# Patient Record
Sex: Female | Born: 1975 | Race: White | Hispanic: No | Marital: Married | Smoking: Never smoker
Health system: Southern US, Community
[De-identification: ages and names within clinical notes are randomized; demographics above are authoritative.]

## PROBLEM LIST (undated history)

## (undated) DIAGNOSIS — I739 Peripheral vascular disease, unspecified: Secondary | ICD-10-CM

## (undated) DIAGNOSIS — O99119 Other diseases of the blood and blood-forming organs and certain disorders involving the immune mechanism complicating pregnancy, unspecified trimester: Secondary | ICD-10-CM

## (undated) DIAGNOSIS — O09529 Supervision of elderly multigravida, unspecified trimester: Secondary | ICD-10-CM

## (undated) DIAGNOSIS — I8390 Asymptomatic varicose veins of unspecified lower extremity: Secondary | ICD-10-CM

## (undated) DIAGNOSIS — D696 Thrombocytopenia, unspecified: Secondary | ICD-10-CM

## (undated) HISTORY — DX: Other diseases of the blood and blood-forming organs and certain disorders involving the immune mechanism complicating pregnancy, unspecified trimester: O99.119

## (undated) HISTORY — PX: WISDOM TOOTH EXTRACTION: SHX21

## (undated) HISTORY — DX: Supervision of elderly multigravida, unspecified trimester: O09.529

## (undated) HISTORY — DX: Asymptomatic varicose veins of unspecified lower extremity: I83.90

## (undated) HISTORY — DX: Other diseases of the blood and blood-forming organs and certain disorders involving the immune mechanism complicating pregnancy, unspecified trimester: D69.6

## (undated) HISTORY — DX: Peripheral vascular disease, unspecified: I73.9

---

## 2009-04-15 ENCOUNTER — Encounter: Admission: RE | Admit: 2009-04-15 | Discharge: 2009-04-15 | Payer: Self-pay | Admitting: Obstetrics and Gynecology

## 2009-05-06 ENCOUNTER — Encounter: Admission: RE | Admit: 2009-05-06 | Discharge: 2009-05-06 | Payer: Self-pay | Admitting: Obstetrics and Gynecology

## 2009-08-19 ENCOUNTER — Encounter: Admission: RE | Admit: 2009-08-19 | Discharge: 2009-08-19 | Payer: Self-pay | Admitting: Obstetrics and Gynecology

## 2010-09-01 ENCOUNTER — Other Ambulatory Visit: Payer: Self-pay | Admitting: Obstetrics and Gynecology

## 2010-09-05 ENCOUNTER — Inpatient Hospital Stay (HOSPITAL_COMMUNITY): Admission: AD | Admit: 2010-09-05 | Discharge: 2010-09-07 | Payer: Self-pay | Admitting: Obstetrics and Gynecology

## 2011-03-08 LAB — CBC
HCT: 31.8 % — ABNORMAL LOW (ref 36.0–46.0)
HCT: 35.7 % — ABNORMAL LOW (ref 36.0–46.0)
Hemoglobin: 10.5 g/dL — ABNORMAL LOW (ref 12.0–15.0)
Hemoglobin: 10.7 g/dL — ABNORMAL LOW (ref 12.0–15.0)
Hemoglobin: 10.8 g/dL — ABNORMAL LOW (ref 12.0–15.0)
Hemoglobin: 11.4 g/dL — ABNORMAL LOW (ref 12.0–15.0)
MCH: 27.4 pg (ref 26.0–34.0)
MCH: 27.8 pg (ref 26.0–34.0)
MCHC: 32.4 g/dL (ref 30.0–36.0)
MCHC: 33.8 g/dL (ref 30.0–36.0)
MCV: 84.5 fL (ref 78.0–100.0)
MCV: 85.9 fL (ref 78.0–100.0)
Platelets: 82 10*3/uL — ABNORMAL LOW (ref 150–400)
Platelets: 84 10*3/uL — ABNORMAL LOW (ref 150–400)
Platelets: 88 10*3/uL — ABNORMAL LOW (ref 150–400)
RBC: 3.4 MIL/uL — ABNORMAL LOW (ref 3.87–5.11)
RBC: 3.72 MIL/uL — ABNORMAL LOW (ref 3.87–5.11)
RBC: 3.77 MIL/uL — ABNORMAL LOW (ref 3.87–5.11)
RBC: 3.79 MIL/uL — ABNORMAL LOW (ref 3.87–5.11)
RBC: 3.86 MIL/uL — ABNORMAL LOW (ref 3.87–5.11)
RBC: 4.15 MIL/uL (ref 3.87–5.11)
RDW: 15.4 % (ref 11.5–15.5)
RDW: 15.5 % (ref 11.5–15.5)
WBC: 13.7 10*3/uL — ABNORMAL HIGH (ref 4.0–10.5)
WBC: 16.6 10*3/uL — ABNORMAL HIGH (ref 4.0–10.5)
WBC: 18.3 10*3/uL — ABNORMAL HIGH (ref 4.0–10.5)
WBC: 8.9 10*3/uL (ref 4.0–10.5)

## 2011-03-08 LAB — COMPREHENSIVE METABOLIC PANEL
ALT: 14 U/L (ref 0–35)
AST: 33 U/L (ref 0–37)
CO2: 23 mEq/L (ref 19–32)
Chloride: 106 mEq/L (ref 96–112)
Creatinine, Ser: 0.66 mg/dL (ref 0.4–1.2)
Glucose, Bld: 120 mg/dL — ABNORMAL HIGH (ref 70–99)
Total Bilirubin: 0.6 mg/dL (ref 0.3–1.2)

## 2011-03-08 LAB — URIC ACID: Uric Acid, Serum: 4.7 mg/dL (ref 2.4–7.0)

## 2011-03-08 LAB — SURGICAL PCR SCREEN
MRSA, PCR: NEGATIVE
Staphylococcus aureus: NEGATIVE

## 2011-03-08 LAB — RPR: RPR Ser Ql: NONREACTIVE

## 2011-03-08 LAB — LACTATE DEHYDROGENASE: LDH: 119 U/L (ref 94–250)

## 2011-12-25 NOTE — L&D Delivery Note (Signed)
Delivery Note At 1:33 PM a viable female was delivered via VBAC, Spontaneous (Presentation: ;  ).  APGAR: , ; weight .   Placenta status: , .  Cord:  with the following complications: .  Cord pH: sent  Anesthesia: Epidural  Episiotomy: None  Lacerations: sec deg Suture Repair: 3.0 vicryl rapide Est. Blood Loss (mL): 350cc  Mom to postpartum.  Baby to nursery-stable.  Meriel Pica 07/25/2012, 1:52 PM

## 2011-12-27 LAB — OB RESULTS CONSOLE HIV ANTIBODY (ROUTINE TESTING): HIV: NONREACTIVE

## 2011-12-27 LAB — OB RESULTS CONSOLE GC/CHLAMYDIA
Chlamydia: NEGATIVE
Gonorrhea: NEGATIVE

## 2011-12-27 LAB — OB RESULTS CONSOLE RPR: RPR: NONREACTIVE

## 2012-07-01 LAB — OB RESULTS CONSOLE GBS: GBS: NEGATIVE

## 2012-07-24 ENCOUNTER — Encounter (HOSPITAL_COMMUNITY): Payer: Self-pay | Admitting: *Deleted

## 2012-07-24 ENCOUNTER — Telehealth (HOSPITAL_COMMUNITY): Payer: Self-pay | Admitting: *Deleted

## 2012-07-24 NOTE — Telephone Encounter (Signed)
Preadmission screen  

## 2012-07-25 ENCOUNTER — Encounter (HOSPITAL_COMMUNITY): Payer: Self-pay

## 2012-07-25 ENCOUNTER — Encounter (HOSPITAL_COMMUNITY): Payer: Self-pay | Admitting: Anesthesiology

## 2012-07-25 ENCOUNTER — Inpatient Hospital Stay (HOSPITAL_COMMUNITY): Payer: 59 | Admitting: Anesthesiology

## 2012-07-25 ENCOUNTER — Inpatient Hospital Stay (HOSPITAL_COMMUNITY)
Admission: RE | Admit: 2012-07-25 | Discharge: 2012-07-26 | DRG: 775 | Disposition: A | Discharge status: Home / Self Care | Payer: 59 | Source: Ambulatory Visit | Attending: Obstetrics and Gynecology | Admitting: Obstetrics and Gynecology

## 2012-07-25 DIAGNOSIS — O34219 Maternal care for unspecified type scar from previous cesarean delivery: Secondary | ICD-10-CM | POA: Diagnosis present

## 2012-07-25 DIAGNOSIS — O09529 Supervision of elderly multigravida, unspecified trimester: Secondary | ICD-10-CM | POA: Diagnosis present

## 2012-07-25 LAB — CBC
HCT: 35.2 % — ABNORMAL LOW (ref 36.0–46.0)
Hemoglobin: 10.9 g/dL — ABNORMAL LOW (ref 12.0–15.0)
MCH: 25.4 pg — ABNORMAL LOW (ref 26.0–34.0)
MCH: 25.5 pg — ABNORMAL LOW (ref 26.0–34.0)
MCV: 82.1 fL (ref 78.0–100.0)
Platelets: 108 10*3/uL — ABNORMAL LOW (ref 150–400)
RBC: 4.29 MIL/uL (ref 3.87–5.11)
RBC: 3.92 MIL/uL (ref 3.87–5.11)
WBC: 10.6 10*3/uL — ABNORMAL HIGH (ref 4.0–10.5)

## 2012-07-25 LAB — RPR: RPR Ser Ql: NONREACTIVE

## 2012-07-25 LAB — ABO/RH: ABO/RH(D): O POS

## 2012-07-25 MED ORDER — SENNOSIDES-DOCUSATE SODIUM 8.6-50 MG PO TABS
2.0000 | ORAL_TABLET | Freq: Every day | ORAL | Status: DC
  Administered 2012-07-25: 2 via ORAL

## 2012-07-25 MED ORDER — BISACODYL 10 MG RE SUPP: 10.0000 mg | Freq: Every day | RECTAL | Status: DC | PRN

## 2012-07-25 MED ORDER — OXYCODONE-ACETAMINOPHEN 5-325 MG PO TABS: 1.0000 | ORAL_TABLET | ORAL | Status: DC | PRN

## 2012-07-25 MED ORDER — OXYCODONE-ACETAMINOPHEN 5-325 MG PO TABS
1.0000 | ORAL_TABLET | Freq: Four times a day (QID) | ORAL | Status: DC | PRN
  Administered 2012-07-25 – 2012-07-26 (×3): 1 via ORAL
  Filled 2012-07-25 (×3): qty 1

## 2012-07-25 MED ORDER — DIPHENHYDRAMINE HCL 25 MG PO CAPS: 25.0000 mg | ORAL_CAPSULE | Freq: Four times a day (QID) | ORAL | Status: DC | PRN

## 2012-07-25 MED ORDER — LACTATED RINGERS IV SOLN: 500.0000 mL | Freq: Once | INTRAVENOUS | Status: DC

## 2012-07-25 MED ORDER — OXYTOCIN 40 UNITS IN LACTATED RINGERS INFUSION - SIMPLE MED
62.5000 mL/h | Freq: Once | INTRAVENOUS | Status: AC
  Administered 2012-07-25: 62.5 mL/h via INTRAVENOUS
  Filled 2012-07-25: qty 1000

## 2012-07-25 MED ORDER — ZOLPIDEM TARTRATE 5 MG PO TABS: 5.0000 mg | ORAL_TABLET | Freq: Every evening | ORAL | Status: DC | PRN

## 2012-07-25 MED ORDER — LACTATED RINGERS IV SOLN
INTRAVENOUS | Status: DC
  Administered 2012-07-25 (×2): via INTRAVENOUS

## 2012-07-25 MED ORDER — LANOLIN HYDROUS EX OINT: TOPICAL_OINTMENT | CUTANEOUS | Status: DC | PRN

## 2012-07-25 MED ORDER — WITCH HAZEL-GLYCERIN EX PADS
1.0000 "application " | MEDICATED_PAD | CUTANEOUS | Status: DC | PRN
  Administered 2012-07-25: 1 via TOPICAL

## 2012-07-25 MED ORDER — CITRIC ACID-SODIUM CITRATE 334-500 MG/5ML PO SOLN: 30.0000 mL | ORAL | Status: DC | PRN

## 2012-07-25 MED ORDER — IBUPROFEN 800 MG PO TABS
800.0000 mg | ORAL_TABLET | Freq: Three times a day (TID) | ORAL | Status: DC | PRN
  Administered 2012-07-25 – 2012-07-26 (×2): 800 mg via ORAL
  Filled 2012-07-25 (×3): qty 1

## 2012-07-25 MED ORDER — FLEET ENEMA 7-19 GM/118ML RE ENEM: 1.0000 | ENEMA | RECTAL | Status: DC | PRN

## 2012-07-25 MED ORDER — MEASLES, MUMPS & RUBELLA VAC ~~LOC~~ INJ
0.5000 mL | INJECTION | Freq: Once | SUBCUTANEOUS | Status: DC
  Filled 2012-07-25: qty 0.5

## 2012-07-25 MED ORDER — IBUPROFEN 600 MG PO TABS: 600.0000 mg | ORAL_TABLET | Freq: Four times a day (QID) | ORAL | Status: DC | PRN

## 2012-07-25 MED ORDER — TETANUS-DIPHTH-ACELL PERTUSSIS 5-2.5-18.5 LF-MCG/0.5 IM SUSP: 0.5000 mL | Freq: Once | INTRAMUSCULAR | Status: DC

## 2012-07-25 MED ORDER — FENTANYL 2.5 MCG/ML BUPIVACAINE 1/10 % EPIDURAL INFUSION (WH - ANES)
14.0000 mL/h | INTRAMUSCULAR | Status: DC
  Administered 2012-07-25: 14 mL/h via EPIDURAL
  Filled 2012-07-25: qty 60

## 2012-07-25 MED ORDER — FLEET ENEMA 7-19 GM/118ML RE ENEM: 1.0000 | ENEMA | Freq: Every day | RECTAL | Status: DC | PRN

## 2012-07-25 MED ORDER — DIBUCAINE 1 % RE OINT
1.0000 "application " | TOPICAL_OINTMENT | RECTAL | Status: DC | PRN
  Administered 2012-07-26: 1 via RECTAL
  Filled 2012-07-25: qty 28

## 2012-07-25 MED ORDER — DIPHENHYDRAMINE HCL 50 MG/ML IJ SOLN: 12.5000 mg | INTRAMUSCULAR | Status: DC | PRN

## 2012-07-25 MED ORDER — ONDANSETRON HCL 4 MG PO TABS: 4.0000 mg | ORAL_TABLET | ORAL | Status: DC | PRN

## 2012-07-25 MED ORDER — LIDOCAINE HCL (PF) 1 % IJ SOLN
INTRAMUSCULAR | Status: DC | PRN
  Administered 2012-07-25 (×2): 5 mL

## 2012-07-25 MED ORDER — BENZOCAINE-MENTHOL 20-0.5 % EX AERO
1.0000 "application " | INHALATION_SPRAY | CUTANEOUS | Status: DC | PRN
  Administered 2012-07-25: 1 via TOPICAL
  Filled 2012-07-25: qty 56

## 2012-07-25 MED ORDER — PHENYLEPHRINE 40 MCG/ML (10ML) SYRINGE FOR IV PUSH (FOR BLOOD PRESSURE SUPPORT)
80.0000 ug | PREFILLED_SYRINGE | INTRAVENOUS | Status: DC | PRN
  Filled 2012-07-25: qty 5

## 2012-07-25 MED ORDER — EPHEDRINE 5 MG/ML INJ
10.0000 mg | INTRAVENOUS | Status: DC | PRN
  Filled 2012-07-25: qty 4

## 2012-07-25 MED ORDER — ACETAMINOPHEN 325 MG PO TABS: 650.0000 mg | ORAL_TABLET | ORAL | Status: DC | PRN

## 2012-07-25 MED ORDER — OXYTOCIN BOLUS FROM INFUSION
250.0000 mL | Freq: Once | INTRAVENOUS | Status: DC
  Filled 2012-07-25: qty 500

## 2012-07-25 MED ORDER — ONDANSETRON HCL 4 MG/2ML IJ SOLN: 4.0000 mg | Freq: Four times a day (QID) | INTRAMUSCULAR | Status: DC | PRN

## 2012-07-25 MED ORDER — LACTATED RINGERS IV SOLN: 500.0000 mL | INTRAVENOUS | Status: DC | PRN

## 2012-07-25 MED ORDER — SIMETHICONE 80 MG PO CHEW
80.0000 mg | CHEWABLE_TABLET | ORAL | Status: DC | PRN
Start: 2012-07-25 — End: 2012-07-26

## 2012-07-25 MED ORDER — ONDANSETRON HCL 4 MG/2ML IJ SOLN: 4.0000 mg | INTRAMUSCULAR | Status: DC | PRN

## 2012-07-25 MED ORDER — LIDOCAINE HCL (PF) 1 % IJ SOLN
30.0000 mL | INTRAMUSCULAR | Status: DC | PRN
  Filled 2012-07-25: qty 30

## 2012-07-25 MED ORDER — PRENATAL MULTIVITAMIN CH
1.0000 | ORAL_TABLET | Freq: Every day | ORAL | Status: DC
  Administered 2012-07-25 – 2012-07-26 (×2): 1 via ORAL
  Filled 2012-07-25 (×2): qty 1

## 2012-07-25 MED ORDER — EPHEDRINE 5 MG/ML INJ: 10.0000 mg | INTRAVENOUS | Status: DC | PRN

## 2012-07-25 MED ORDER — PHENYLEPHRINE 40 MCG/ML (10ML) SYRINGE FOR IV PUSH (FOR BLOOD PRESSURE SUPPORT): 80.0000 ug | PREFILLED_SYRINGE | INTRAVENOUS | Status: DC | PRN

## 2012-07-25 NOTE — Anesthesia Procedure Notes (Signed)
Epidural Patient location during procedure: OB Start time: 07/25/2012 10:29 AM  Staffing Anesthesiologist: Brayton Caves R Performed by: anesthesiologist   Preanesthetic Checklist Completed: patient identified, site marked, surgical consent, pre-op evaluation, timeout performed, IV checked, risks and benefits discussed and monitors and equipment checked  Epidural Patient position: sitting Prep: site prepped and draped and DuraPrep Patient monitoring: continuous pulse ox and blood pressure Approach: midline Injection technique: LOR air and LOR saline  Needle:  Needle type: Tuohy  Needle gauge: 17 G Needle length: 9 cm Needle insertion depth: 5 cm cm Catheter type: closed end flexible Catheter size: 19 Gauge Catheter at skin depth: 10 cm Test dose: negative  Assessment Events: blood not aspirated, injection not painful, no injection resistance, negative IV test and no paresthesia  Additional Notes Patient identified.  Risk benefits discussed including failed block, incomplete pain control, headache, nerve damage, paralysis, blood pressure changes, nausea, vomiting, reactions to medication both toxic or allergic, and postpartum back pain.  Patient expressed understanding and wished to proceed.  All questions were answered.  Sterile technique used throughout procedure and epidural site dressed with sterile barrier dressing. No paresthesia or other complications noted.The patient did not experience any signs of intravascular injection such as tinnitus or metallic taste in mouth nor signs of intrathecal spread such as rapid motor block. Please see nursing notes for vital signs.

## 2012-07-25 NOTE — Progress Notes (Signed)
Patient ID: Deanna Wolf, female   DOB: [birth year], 36 y.o.   MRN: [id number] 4/90/vtx/-2>>AROM>>clear, stable FHR>>for epid

## 2012-07-25 NOTE — Anesthesia Preprocedure Evaluation (Deleted)
Anesthesia Evaluation  Patient identified by MRN, date of birth, ID band Patient awake    Reviewed: Allergy & Precautions, H&P , NPO status , Patient's Chart, lab work & pertinent test results, reviewed documented beta blocker date and time   Airway Mallampati: II TM Distance: >3 FB Neck ROM: full    Dental No notable dental hx.    Pulmonary neg pulmonary ROS,  breath sounds clear to auscultation  Pulmonary exam normal       Cardiovascular negative cardio ROS  Rhythm:regular Rate:Normal     Neuro/Psych negative neurological ROS  negative psych ROS   GI/Hepatic negative GI ROS, Neg liver ROS,   Endo/Other  negative endocrine ROS  Renal/GU      Musculoskeletal   Abdominal Normal abdominal exam  (+)   Peds  Hematology negative hematology ROS (+)   Anesthesia Other Findings   Reproductive/Obstetrics (+) Pregnancy                           Anesthesia Physical Anesthesia Plan Anesthesia Quick Evaluation

## 2012-07-25 NOTE — H&P (Signed)
NAMENOA, GALVAO NO.:  [id number]  MEDICAL RECORD NO.:  [id number]  LOCATION:                                 FACILITY:  PHYSICIAN:  Duke Salvia. Marcelle Overlie, M.D.DATE OF BIRTH:  June 17, 1976  DATE OF ADMISSION:  07/25/2012 DATE OF DISCHARGE:                             HISTORY & PHYSICAL   CHIEF COMPLAINT:  For labor induction at term.  HISTORY OF PRESENT ILLNESS:  A 36 year old, G4 P2-0-1-2 EDD is July 28, 2012.  She presents at term with a favorable cervix for labor induction after an uncomplicated pregnancy.  She has had 1 prior LTCS in [place] in 2009 followed by successful VBAC in 2011 and wants to pursue VBAC. Specific risks of VBAC related to fetal distress, the need for cesarean section, uterine rupture had been discussed with her and she has signed the VBAC informed consent information in our office.  Blood type is O positive.  GBS was negative.  PAST MEDICAL HISTORY:  Please see the [facility] form for completion of her past medical history details.  PHYSICAL EXAMINATION:  VITAL SIGNS:  Temp 98.2, blood pressure 120/72. HEENT:  Unremarkable. NECK:  Supple without masses. LUNGS:  Clear. CARDIOVASCULAR:  Regular rate and rhythm without murmurs, rubs, or gallops noted.  BREASTS :  Not examined. PELVIC:  Term fundal height.  Fetal heart rate 140.  Cervix was 3-4, 75%, vertex, -2.  Membranes intact. EXTREMITIES:  Unremarkable. NEUROLOGIC:  Unremarkable.  IMPRESSION: 1. Term intrauterine pregnancy, favorable cervix. 2. History of prior low transverse cesarean section for vaginal birth     after a cesarean. Procedure and risks discussed as above.  PLAN:  AROM induction.     Amandalynn Pitz M. Marcelle Overlie, M.D.    [facility]/MEDQ  D:  07/24/2012  T:  07/24/2012  Job:  [id number]

## 2012-07-25 NOTE — Anesthesia Preprocedure Evaluation (Deleted)
Anesthesia Evaluation  Patient identified by MRN, date of birth, ID band Patient awake    Reviewed: Allergy & Precautions, H&P , NPO status , Patient's Chart, lab work & pertinent test results, reviewed documented beta blocker date and time   History of Anesthesia Complications (+) AWARENESS UNDER ANESTHESIA  Airway Mallampati: II TM Distance: >3 FB Neck ROM: full    Dental No notable dental hx.    Pulmonary neg pulmonary ROS,  breath sounds clear to auscultation  Pulmonary exam normal       Cardiovascular negative cardio ROS  Rhythm:regular Rate:Normal     Neuro/Psych negative neurological ROS  negative psych ROS   GI/Hepatic negative GI ROS, Neg liver ROS,   Endo/Other  negative endocrine ROS  Renal/GU      Musculoskeletal   Abdominal Normal abdominal exam  (+)   Peds  Hematology negative hematology ROS (+)   Anesthesia Other Findings   Reproductive/Obstetrics (+) Pregnancy                          Anesthesia Physical Anesthesia Plan Anesthesia Quick Evaluation

## 2012-07-25 NOTE — Anesthesia Preprocedure Evaluation (Signed)
Anesthesia Evaluation  Patient identified by MRN, date of birth, ID band Patient awake    Reviewed: Allergy & Precautions, H&P , NPO status , Patient's Chart, lab work & pertinent test results, reviewed documented beta blocker date and time   Airway Mallampati: II TM Distance: >3 FB Neck ROM: full    Dental No notable dental hx.    Pulmonary neg pulmonary ROS,  breath sounds clear to auscultation  Pulmonary exam normal       Cardiovascular negative cardio ROS  Rhythm:regular Rate:Normal     Neuro/Psych negative neurological ROS  negative psych ROS   GI/Hepatic negative GI ROS, Neg liver ROS,   Endo/Other  negative endocrine ROS  Renal/GU      Musculoskeletal   Abdominal Normal abdominal exam  (+)   Peds  Hematology negative hematology ROS (+)   Anesthesia Other Findings   Reproductive/Obstetrics (+) Pregnancy                           Anesthesia Physical Anesthesia Plan  ASA: II  Anesthesia Plan: Epidural   Post-op Pain Management:    Induction:   Airway Management Planned:   Additional Equipment:   Intra-op Plan:   Post-operative Plan:   Informed Consent: I have reviewed the patients History and Physical, chart, labs and discussed the procedure including the risks, benefits and alternatives for the proposed anesthesia with the patient or authorized representative who has indicated his/her understanding and acceptance.     Plan Discussed with:   Anesthesia Plan Comments:         Anesthesia Quick Evaluation

## 2012-07-25 NOTE — H&P (Signed)
Deanna Wolf  DICTATION # [id number] CSN# [id number]   Meriel Pica, MD 07/25/2012 9:57 AM

## 2012-07-26 LAB — CBC
HCT: 27.9 % — ABNORMAL LOW (ref 36.0–46.0)
RDW: 16.1 % — ABNORMAL HIGH (ref 11.5–15.5)
WBC: 11 10*3/uL — ABNORMAL HIGH (ref 4.0–10.5)

## 2012-07-26 MED ORDER — IBUPROFEN 800 MG PO TABS: 800.0000 mg | ORAL_TABLET | Freq: Three times a day (TID) | ORAL | Status: AC | PRN

## 2012-07-26 MED ORDER — OXYCODONE-ACETAMINOPHEN 5-325 MG PO TABS: 1.0000 | ORAL_TABLET | Freq: Four times a day (QID) | ORAL | Status: AC | PRN

## 2012-07-26 NOTE — Discharge Summary (Signed)
Obstetric Discharge Summary Reason for Admission: induction of labor Prenatal Procedures: none Intrapartum Procedures: spontaneous vaginal delivery Postpartum Procedures: none Complications-Operative and Postpartum: none Hemoglobin  Date Value Range Status  07/26/2012 8.8* 12.0 - 15.0 g/dL Final     HCT  Date Value Range Status  07/26/2012 27.9* 36.0 - 46.0 % Final    Physical Exam:  General: alert Lochia: appropriate Uterine Fundus: firm Incision: healing well DVT Evaluation: No evidence of DVT seen on physical exam.  Discharge Diagnoses: Term Pregnancy-delivered, gestational TCP  Discharge Information: Date: 07/26/2012 Activity: pelvic rest Diet: routine Medications: Ibuprofen and Percocet Condition: stable Instructions: refer to practice specific booklet Discharge to: home Follow-up Information    Follow up with Meriel Pica, MD. Schedule an appointment as soon as possible for a visit in 6 weeks.   Contact information:   [address] [place] [address] [phone]          Newborn Data: Live born female  Birth Weight: 8 lb 6.6 oz (3815 g) APGAR: 9, 9  Home with mother.  Meriel Pica 07/26/2012, 8:51 AM

## 2012-07-26 NOTE — Progress Notes (Signed)
Post Partum Day 1 Subjective: no complaints  Objective: Blood pressure 100/66, pulse 79, temperature 97.5 F (36.4 C), temperature source Oral, resp. rate 18, height 5\' 7"  (1.702 m), weight 183 lb (83.008 kg), last menstrual period 10/22/2011, SpO2 98.00%, unknown if currently breastfeeding.  Physical Exam:  General: alert Lochia: appropriate Uterine Fundus: firm Incision: healing well DVT Evaluation: No evidence of DVT seen on physical exam.   Basename 07/26/12 0530 07/25/12 1414  HGB 8.8* 10.0*  HCT 27.9* 32.3*   CBC    Component Value Date/Time   WBC 11.0* 07/26/2012 0530   RBC 3.41* 07/26/2012 0530   HGB 8.8* 07/26/2012 0530   HCT 27.9* 07/26/2012 0530   PLT 99* 07/26/2012 0530   MCV 81.8 07/26/2012 0530   MCH 25.8* 07/26/2012 0530   MCHC 31.5 07/26/2012 0530   RDW 16.1* 07/26/2012 0530      Assessment/Plan: Discharge home   LOS: 1 day   Graeme Menees M 07/26/2012, 8:49 AM

## 2012-07-27 ENCOUNTER — Inpatient Hospital Stay (HOSPITAL_COMMUNITY): Admission: AD | Admit: 2012-07-27 | Payer: Self-pay | Source: Ambulatory Visit | Admitting: Obstetrics and Gynecology

## 2012-07-29 LAB — TYPE AND SCREEN

## 2012-10-27 ENCOUNTER — Encounter: Payer: Self-pay | Admitting: Vascular Surgery

## 2012-10-28 ENCOUNTER — Encounter: Payer: Self-pay | Admitting: Vascular Surgery

## 2012-10-28 ENCOUNTER — Encounter (INDEPENDENT_AMBULATORY_CARE_PROVIDER_SITE_OTHER): Payer: 59 | Admitting: *Deleted

## 2012-10-28 ENCOUNTER — Ambulatory Visit (INDEPENDENT_AMBULATORY_CARE_PROVIDER_SITE_OTHER): Payer: 59 | Admitting: Vascular Surgery

## 2012-10-28 VITALS — BP 118/79 | HR 74 | Resp 16 | Ht 67.0 in | Wt 145.0 lb

## 2012-10-28 DIAGNOSIS — M79609 Pain in unspecified limb: Secondary | ICD-10-CM

## 2012-10-28 DIAGNOSIS — I83893 Varicose veins of bilateral lower extremities with other complications: Secondary | ICD-10-CM | POA: Insufficient documentation

## 2012-10-28 DIAGNOSIS — M7989 Other specified soft tissue disorders: Secondary | ICD-10-CM

## 2012-10-28 NOTE — Progress Notes (Signed)
Subjective:     Patient ID: Deanna Wolf, female   DOB: 08/19/1976, 36 y.o.   MRN: [id number]  HPI this 36 year old healthy female has had bulging varicosities in the right leg for the past 5 years since her first pregnancy. She has no history of DVT or thrombophlebitis were stasis ulcers or bleeding. After each pregnancy the varicosities in the right medial calf have become larger and more symptomatic. This consists of aching throbbing and burning discomfort. She has no symptoms in the left leg. Her last child was delivered 3 months ago. Her varicosities have persisted. She was evaluated at [facility] in January of 2013 and laser ablation of the right great saphenous vein was recommended. His was approved in October of 2013 and she sided to come here for treatment. She has been wearing long leg elastic compression stockings 20-30 mm gradient since January of 2013. She works as a Psychologist, clinical. She does try elevating her legs when she can and trying ibuprofen without success.  Past Medical History  Diagnosis Date  . AMA (advanced maternal age) multigravida 35+   . Thrombocytopenia complicating pregnancy   . Varicose veins     History  Substance Use Topics  . Smoking status: Never Smoker   . Smokeless tobacco: Never Used  . Alcohol Use: No    Family History  Problem Relation Age of Onset  . Cancer Mother     breast  . Hypertension Father     No Known Allergies  No current outpatient prescriptions on file.  BP 118/79  Pulse 74  Resp 16  Ht 5\' 7"  (1.702 m)  Wt 145 lb (65.772 kg)  BMI 22.71 kg/m2  Body mass index is 22.71 kg/(m^2).          Review of Systems denies chest pain, dyspnea on exertion, PND, orthopnea, hemoptysis. Biggest problem is swelling in the right leg which worsens as the day progresses. Other systems negative and complete review of systems    Objective:   Physical Exam blood pressure 118/79 heart rate 74 respirations 16 Gen.-alert and  oriented x3 in no apparent distress HEENT normal for age Lungs no rhonchi or wheezing Cardiovascular regular rhythm no murmurs carotid pulses 3+ palpable no bruits audible Abdomen soft nontender no palpable masses Musculoskeletal free of  major deformities Skin clear -no rashes Neurologic normal Lower extremities 3+ femoral and dorsalis pedis pulses palpable bilaterally with 1+ edema right calf and ankle no edema on the left Bulging varicosities in the right medial calf down to the ankle over great saphenous system with spider veins emanating anteriorly and posteriorly from this area. No hyperpigmentation or ulceration is noted. Left leg is free of varicosities.  Today I ordered venous duplex exam of both legs I reviewed and interpreted. There is gross reflux throughout the right great saphenous vein supplying these bulging varicosities. Right small saphenous vein has no reflux. There is no DVT. There is mild deep vein reflux on the right. Left leg has gross reflux in the great saphenous vein but the vein caliber is smaller and there are no bulging varicosities on the left.      Assessment:     Gross reflux right great saphenous vein with bulging varicosities and swelling causing severe symptoms-resistant to conservative measures-long-leg elastic compression stockings 20-30 mm gradient, ibuprofen, and elevation. Patient has been approved for laser ablation right great saphenous vein recently Best plan would be to proceed with laser ablation right great saphenous vein with greater than 20  stab phlebectomy as single procedure to relieve her symptomatology    Plan:     Will proceed with precertification to perform laser ablation right great saphenous vein with greater than 20 stab phlebectomy in near future

## 2012-11-04 ENCOUNTER — Encounter: Payer: 59 | Admitting: Vascular Surgery

## 2012-11-06 ENCOUNTER — Other Ambulatory Visit: Payer: Self-pay | Admitting: *Deleted

## 2012-11-06 DIAGNOSIS — I83893 Varicose veins of bilateral lower extremities with other complications: Secondary | ICD-10-CM

## 2012-11-11 ENCOUNTER — Encounter: Payer: 59 | Admitting: Vascular Surgery

## 2012-11-17 ENCOUNTER — Encounter: Payer: 59 | Admitting: Vascular Surgery

## 2012-12-19 ENCOUNTER — Encounter: Payer: Self-pay | Admitting: Vascular Surgery

## 2012-12-22 ENCOUNTER — Ambulatory Visit (INDEPENDENT_AMBULATORY_CARE_PROVIDER_SITE_OTHER): Payer: 59 | Admitting: Vascular Surgery

## 2012-12-22 ENCOUNTER — Encounter: Payer: Self-pay | Admitting: Vascular Surgery

## 2012-12-22 ENCOUNTER — Other Ambulatory Visit: Payer: 59 | Admitting: Vascular Surgery

## 2012-12-22 VITALS — BP 116/78 | HR 66 | Resp 16 | Ht 67.0 in | Wt 145.0 lb

## 2012-12-22 DIAGNOSIS — I83893 Varicose veins of bilateral lower extremities with other complications: Secondary | ICD-10-CM

## 2012-12-22 NOTE — Progress Notes (Signed)
Laser Ablation Procedure      Date: 12/22/2012    Deanna Wolf DOB:06-09-1976  Consent signed: Yes  Surgeon:J.D. Hart [place]  Procedure: Laser Ablation: right Greater Saphenous Vein  BP 116/78  Pulse 66  Resp 16  Ht 5\' 7"  (1.702 m)  Wt 145 lb (65.772 kg)  BMI 22.71 kg/m2  Start time: 1:00   End time: 2:15  Tumescent Anesthesia: 400 cc 0.9% NaCl with 50 cc Lidocaine HCL with 1% Epi and 15 cc 8.4% NaHCO3  Local Anesthesia: 10 cc Lidocaine HCL and NaHCO3 (ratio 2:1)  Pulsed mode: Watts 15 Seconds 1 Pulses:1 Total Pulses:147 Total Energy: 2204 Total Time: 2:26    Stab Phlebectomy: >20 Sites: Calf  Patient tolerated procedure well: Yes  Notes:   Description of Procedure:  After marking the course of the saphenous vein and the secondary varicosities in the standing position, the patient was placed on the operating table in the supine position, and the right leg was prepped and draped in sterile fashion. Local anesthetic was administered, and under ultrasound guidance the saphenous vein was accessed with a micro needle and guide wire; then the micro puncture sheath was placed. A guide wire was inserted to the saphenofemoral junction, followed by a 5 french sheath.  The position of the sheath and then the laser fiber below the junction was confirmed using the ultrasound and visualization of the aiming beam.  Tumescent anesthesia was administered along the course of the saphenous vein using ultrasound guidance. Protective laser glasses were placed on the patient, and the laser was fired at 15 watt pulsed mode advancing 1-2 mm per sec.  For a total of 2204 joules.  A steri strip was applied to the puncture site.  The patient was then put into Trendelenburg position.  Local anesthetic was utilized overlying the marked varicosities.  Greater than 20 stab wounds were made using the tip of an 11 blade; and using the vein hook,  The phlebectomies were performed using a hemostat to avulse these  varicosities.  Adequate hemostasis was achieved, and steri strips were applied to the stab wound.      ABD pads and thigh high compression stockings were applied.  Ace wrap bandages were applied over the phlebectomy sites and at the top of the saphenofemoral junction.  Blood loss was less than 15 cc.  The patient ambulated out of the operating room having tolerated the procedure well.

## 2012-12-22 NOTE — Progress Notes (Signed)
Subjective:     Patient ID: Deanna Wolf, female   DOB: Jul 22, 1976, 36 y.o.   MRN: [id number]  HPI this 36 year old female had laser ablation of the right great saphenous vein performed under local tumescent anesthesia plus greater than 20 stab phlebectomy of painful varicosities. She tolerated the procedure well. A total of 2204 J of energy was utilized    Review of Systems     Objective:   Physical ExamBP 116/78  Pulse 66  Resp 16  Ht 5\' 7"  (1.702 m)  Wt 145 lb (65.772 kg)  BMI 22.71 kg/m2      Assessment:     Well-tolerated laser ablation right great saphenous vein plus multiple stab phlebectomy for secondary varicosities performed under local tumescent anesthesia    Plan:     Return in one week for venous duplex exam right leg to confirm closure right great saphenous vein.

## 2012-12-23 ENCOUNTER — Telehealth: Payer: Self-pay | Admitting: *Deleted

## 2012-12-23 NOTE — Telephone Encounter (Signed)
Left a voice mail. Asked her to call if she is having any problems or concerns. Reminded her of her fu appt.

## 2012-12-29 ENCOUNTER — Encounter: Payer: Self-pay | Admitting: Vascular Surgery

## 2012-12-30 ENCOUNTER — Ambulatory Visit: Payer: 59 | Admitting: Vascular Surgery

## 2012-12-30 ENCOUNTER — Ambulatory Visit (INDEPENDENT_AMBULATORY_CARE_PROVIDER_SITE_OTHER): Payer: 59 | Admitting: Vascular Surgery

## 2012-12-30 ENCOUNTER — Encounter (INDEPENDENT_AMBULATORY_CARE_PROVIDER_SITE_OTHER): Payer: 59 | Admitting: *Deleted

## 2012-12-30 ENCOUNTER — Encounter: Payer: Self-pay | Admitting: Vascular Surgery

## 2012-12-30 VITALS — BP 117/85 | HR 69 | Resp 16 | Ht 66.0 in | Wt 145.0 lb

## 2012-12-30 DIAGNOSIS — I83893 Varicose veins of bilateral lower extremities with other complications: Secondary | ICD-10-CM

## 2012-12-30 DIAGNOSIS — Z48812 Encounter for surgical aftercare following surgery on the circulatory system: Secondary | ICD-10-CM

## 2012-12-30 NOTE — Progress Notes (Signed)
Subjective:     Patient ID: Deanna Wolf, female   DOB: 22-Nov-1976, 37 y.o.   MRN: [id number]  HPI this 37 year old female returns 1 week post laser ablation right great saphenous vein with multiple stab phlebectomy of painful varicosities. She has had no significant edema and has had mild discomfort along the laser ablation site which responded to ice initially and then heat and ibuprofen. She is wearing elastic compression stocking as prescribed.   Review of Systems     Objective:   Physical ExamBP 117/85  Pulse 69  Resp 16  Ht 5\' 6"  (1.676 m)  Wt 145 lb (65.772 kg)  BMI 23.40 kg/m2  General well-developed well-nourished female no apparent stress alert and oriented x3 Right leg with mild discomfort along the course of the great saphenous vein with no ecchymosis. Minimal distal edema noted. Stab phlebectomy sites and calf healing nicely.  Today I ordered a venous duplex exam of the right leg which are reviewed and interpreted. The right greater saphenous vein is occluded up to a 0.1 0.4 cm from the saphenofemoral junction. There is no DVT.    Assessment:     Successful laser ablation right great saphenous vein with multiple stab phlebectomy for painful varicosities    Plan:     Will wear stockings for one more week and then return to see [place] on a when necessary basis

## 2013-01-05 ENCOUNTER — Other Ambulatory Visit: Payer: Self-pay | Admitting: *Deleted

## 2013-01-05 DIAGNOSIS — I83893 Varicose veins of bilateral lower extremities with other complications: Secondary | ICD-10-CM

## 2014-10-25 ENCOUNTER — Encounter: Payer: Self-pay | Admitting: Vascular Surgery

## 2015-05-20 ENCOUNTER — Other Ambulatory Visit: Payer: Self-pay | Admitting: Obstetrics and Gynecology

## 2015-05-20 DIAGNOSIS — Z803 Family history of malignant neoplasm of breast: Secondary | ICD-10-CM

## 2015-06-16 ENCOUNTER — Other Ambulatory Visit: Payer: 59

## 2015-06-29 ENCOUNTER — Other Ambulatory Visit: Payer: 59

## 2015-07-18 ENCOUNTER — Ambulatory Visit
Admission: RE | Admit: 2015-07-18 | Discharge: 2015-07-18 | Disposition: A | Discharge status: Home / Self Care | Payer: 59 | Source: Ambulatory Visit | Attending: Obstetrics and Gynecology | Admitting: Obstetrics and Gynecology

## 2015-07-18 DIAGNOSIS — Z803 Family history of malignant neoplasm of breast: Secondary | ICD-10-CM

## 2015-07-18 MED ORDER — GADOBENATE DIMEGLUMINE 529 MG/ML IV SOLN
13.0000 mL | Freq: Once | INTRAVENOUS | Status: AC | PRN
  Administered 2015-07-18: 13 mL via INTRAVENOUS

## 2016-05-15 DIAGNOSIS — R430 Anosmia: Secondary | ICD-10-CM | POA: Diagnosis not present

## 2016-05-15 DIAGNOSIS — J310 Chronic rhinitis: Secondary | ICD-10-CM | POA: Diagnosis not present

## 2016-05-15 DIAGNOSIS — J343 Hypertrophy of nasal turbinates: Secondary | ICD-10-CM | POA: Diagnosis not present

## 2016-05-17 ENCOUNTER — Other Ambulatory Visit (INDEPENDENT_AMBULATORY_CARE_PROVIDER_SITE_OTHER): Payer: Self-pay | Admitting: Otolaryngology

## 2016-05-17 DIAGNOSIS — J329 Chronic sinusitis, unspecified: Secondary | ICD-10-CM

## 2016-05-22 ENCOUNTER — Other Ambulatory Visit: Payer: 59

## 2016-05-31 ENCOUNTER — Ambulatory Visit
Admission: RE | Admit: 2016-05-31 | Discharge: 2016-05-31 | Disposition: A | Discharge status: Home / Self Care | Payer: BLUE CROSS/BLUE SHIELD | Source: Ambulatory Visit | Attending: Otolaryngology | Admitting: Otolaryngology

## 2016-05-31 DIAGNOSIS — J329 Chronic sinusitis, unspecified: Secondary | ICD-10-CM

## 2016-05-31 DIAGNOSIS — R51 Headache: Secondary | ICD-10-CM | POA: Diagnosis not present

## 2016-06-17 IMAGING — CT CT MAXILLOFACIAL W/O CM
2 series · 16 of 30 positions shown, 18 images · non-contrast
Comparison: None.

CLINICAL DATA: 40-year-old female with facial pain and pressure
symptoms for 1 year. Initial encounter.

EXAM:
CT MAXILLOFACIAL WITHOUT CONTRAST
TECHNIQUE: Multidetector CT imaging of the maxillofacial structures was
performed. Multiplanar CT image reconstructions were also generated.
A small metallic BB was placed on the right temple in order to
reliably differentiate right from left.

[Series 3: axial soft 1.25 · axial · 0.49mm/px · z∈[-57,+110]mm · 8 of 170 slices shown]
[im 18/170  brain]
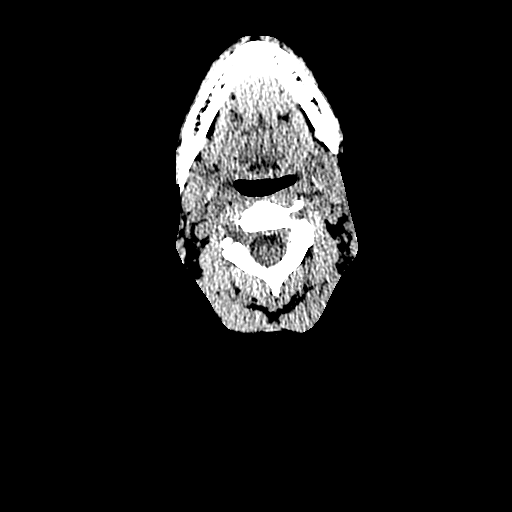
[im 36/170  brain]
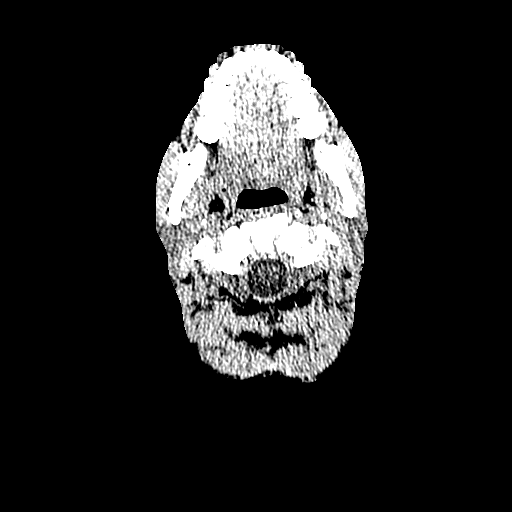
[im 54/170  brain]
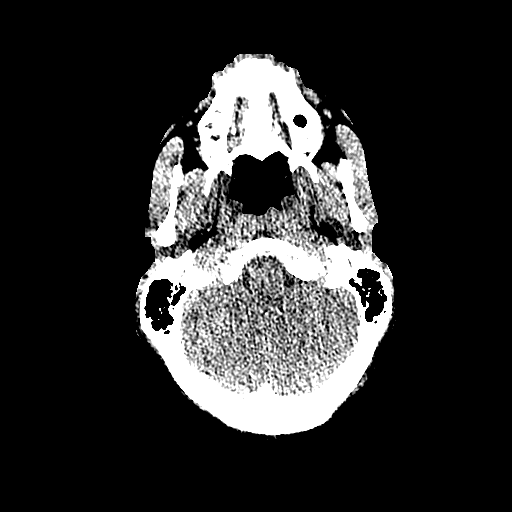
[im 72/170  brain]
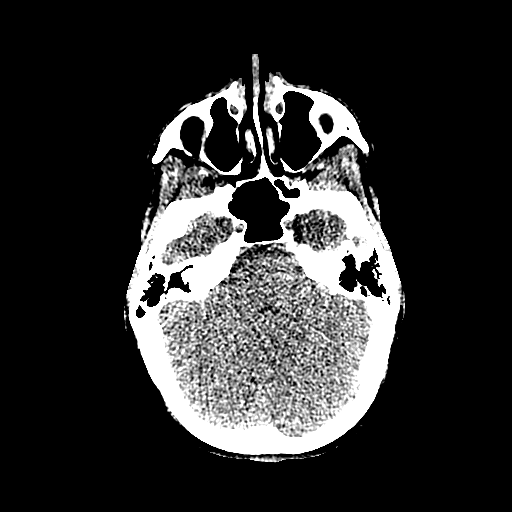
[im 98/170  brain]
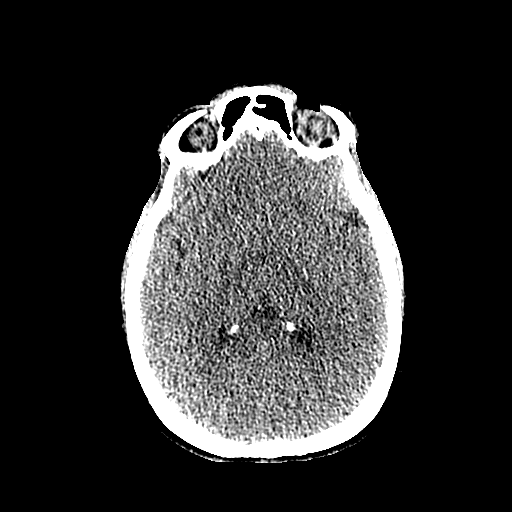
[im 116/170  brain]
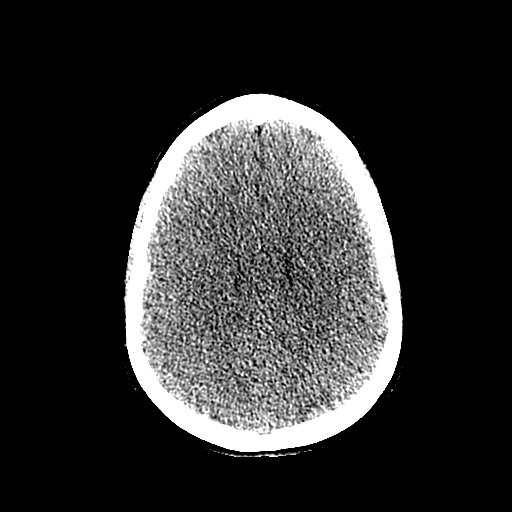
[im 134/170  brain]
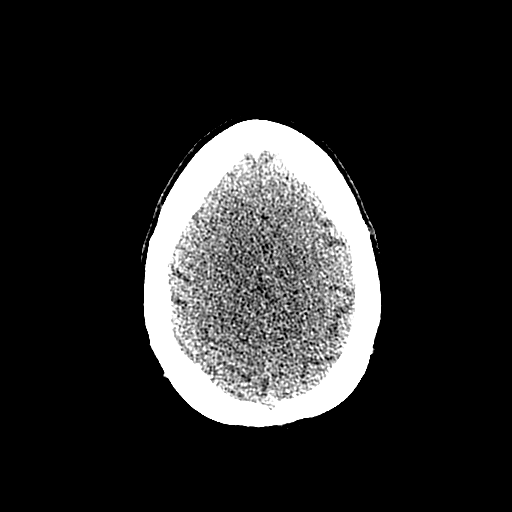
[im 152/170  brain]
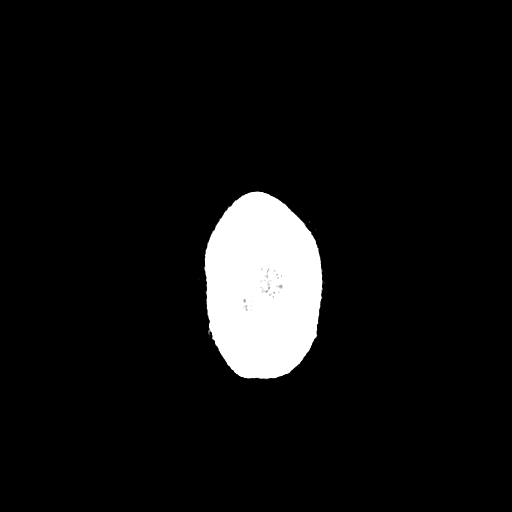

[Series 602: sagittal facial · sagittal · 0.49mm/px · 8 of 80 slices shown, 10 images]
[im 9/80  brain]
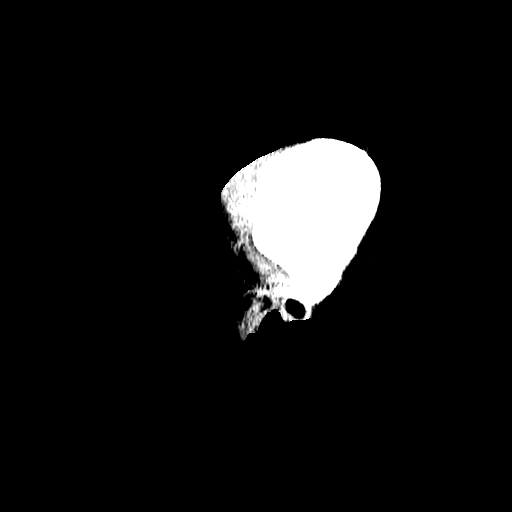
[im 9/80  bone]
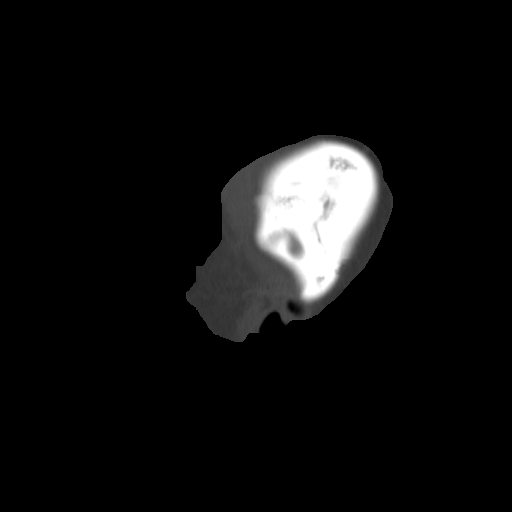
[im 18/80  bone]
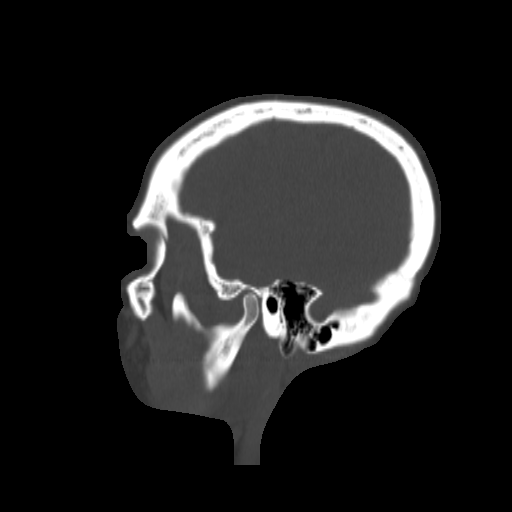
[im 27/80  bone]
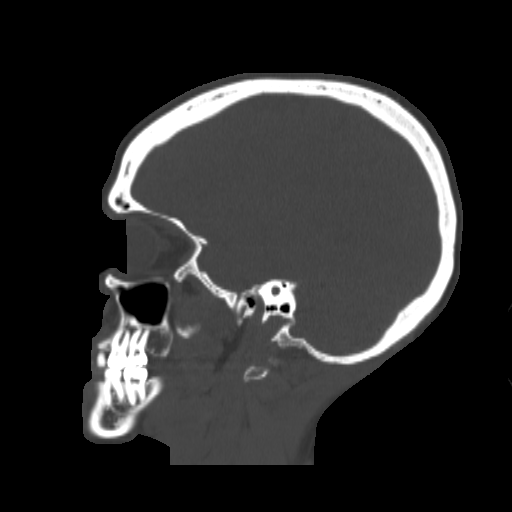
[im 36/80  bone]
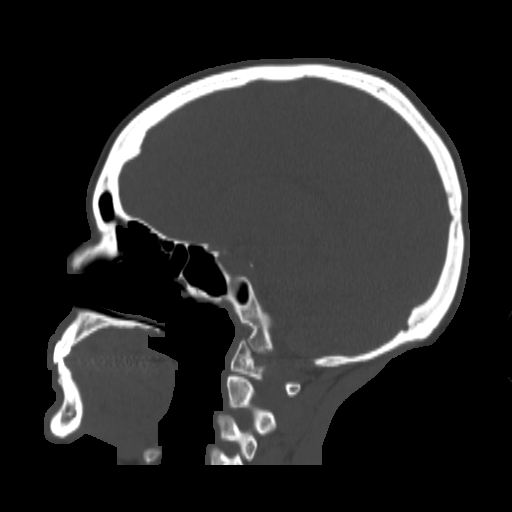
[im 44/80  brain]
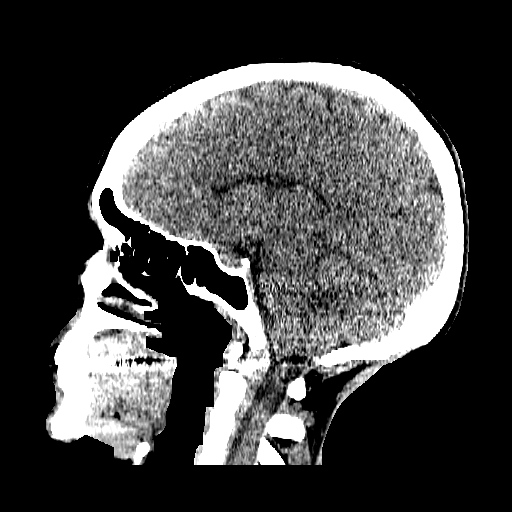
[im 44/80  bone]
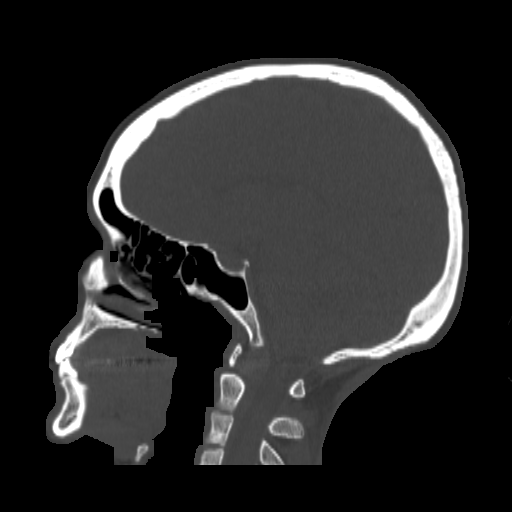
[im 53/80  bone]
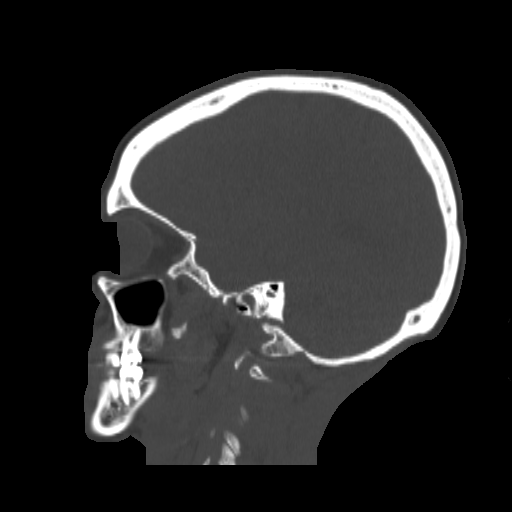
[im 62/80  bone]
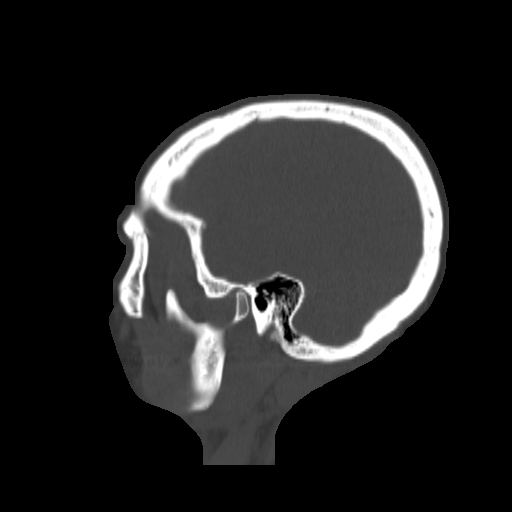
[im 71/80  bone]
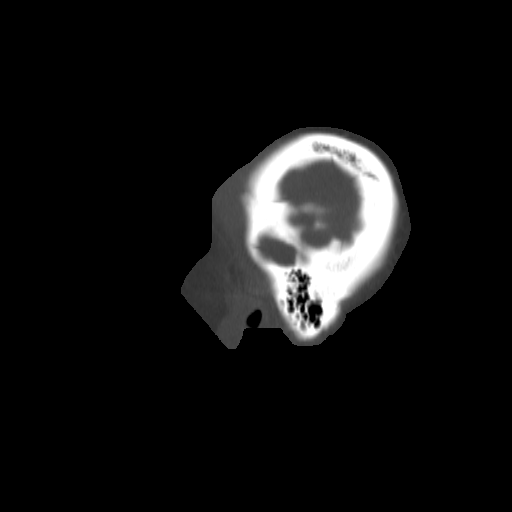

[16 of 30 positions shown; findings below may reference images not displayed]

FINDINGS: Negative visualized noncontrast brain parenchyma. Orbits soft
tissues appear normal. Negative scalp soft tissues. Negative
visualized noncontrast deep soft tissue spaces of the face and neck.

Bilateral tympanic cavities and mastoid air cells are clear.

Both sphenoid sinuses are clear, the right is mildly hyperplastic.

Bilateral ethmoid air cells are clear.

Both frontal sinuses are clear.

Both maxillary sinuses are clear. Both OMCs appear patent (coronal
images 27 and 28).

Symmetric appearing nasal cavity mucosal thickening, which might
indicate rhinitis. Slight nasal septal deviation. No osseous
abnormality identified.
IMPRESSION: No paranasal sinus disease identified.

## 2016-07-17 DIAGNOSIS — Z01419 Encounter for gynecological examination (general) (routine) without abnormal findings: Secondary | ICD-10-CM | POA: Diagnosis not present

## 2016-07-17 DIAGNOSIS — Z6822 Body mass index (BMI) 22.0-22.9, adult: Secondary | ICD-10-CM | POA: Diagnosis not present

## 2016-07-17 DIAGNOSIS — Z1231 Encounter for screening mammogram for malignant neoplasm of breast: Secondary | ICD-10-CM | POA: Diagnosis not present

## 2016-07-25 ENCOUNTER — Other Ambulatory Visit: Payer: Self-pay | Admitting: Obstetrics and Gynecology

## 2016-07-25 DIAGNOSIS — Z803 Family history of malignant neoplasm of breast: Secondary | ICD-10-CM

## 2016-09-05 DIAGNOSIS — J310 Chronic rhinitis: Secondary | ICD-10-CM | POA: Diagnosis not present

## 2016-09-05 DIAGNOSIS — J343 Hypertrophy of nasal turbinates: Secondary | ICD-10-CM | POA: Diagnosis not present

## 2016-09-05 DIAGNOSIS — R430 Anosmia: Secondary | ICD-10-CM | POA: Diagnosis not present

## 2016-09-17 DIAGNOSIS — R8299 Other abnormal findings in urine: Secondary | ICD-10-CM | POA: Diagnosis not present

## 2016-09-17 DIAGNOSIS — E7800 Pure hypercholesterolemia, unspecified: Secondary | ICD-10-CM | POA: Diagnosis not present

## 2016-09-24 DIAGNOSIS — Z0000 Encounter for general adult medical examination without abnormal findings: Secondary | ICD-10-CM | POA: Diagnosis not present

## 2016-09-24 DIAGNOSIS — D229 Melanocytic nevi, unspecified: Secondary | ICD-10-CM | POA: Diagnosis not present

## 2016-09-24 DIAGNOSIS — J3089 Other allergic rhinitis: Secondary | ICD-10-CM | POA: Diagnosis not present

## 2016-09-24 DIAGNOSIS — E781 Pure hyperglyceridemia: Secondary | ICD-10-CM | POA: Diagnosis not present

## 2016-09-24 DIAGNOSIS — Z23 Encounter for immunization: Secondary | ICD-10-CM | POA: Diagnosis not present

## 2016-09-24 DIAGNOSIS — Z1389 Encounter for screening for other disorder: Secondary | ICD-10-CM | POA: Diagnosis not present

## 2016-10-15 DIAGNOSIS — K6289 Other specified diseases of anus and rectum: Secondary | ICD-10-CM | POA: Diagnosis not present

## 2016-11-07 DIAGNOSIS — L853 Xerosis cutis: Secondary | ICD-10-CM | POA: Diagnosis not present

## 2016-11-07 DIAGNOSIS — D2271 Melanocytic nevi of right lower limb, including hip: Secondary | ICD-10-CM | POA: Diagnosis not present

## 2016-11-07 DIAGNOSIS — L821 Other seborrheic keratosis: Secondary | ICD-10-CM | POA: Diagnosis not present

## 2016-11-07 DIAGNOSIS — D2272 Melanocytic nevi of left lower limb, including hip: Secondary | ICD-10-CM | POA: Diagnosis not present

## 2016-11-22 ENCOUNTER — Ambulatory Visit
Admission: RE | Admit: 2016-11-22 | Discharge: 2016-11-22 | Disposition: A | Discharge status: Home / Self Care | Payer: BLUE CROSS/BLUE SHIELD | Source: Ambulatory Visit | Attending: Obstetrics and Gynecology | Admitting: Obstetrics and Gynecology

## 2016-11-22 ENCOUNTER — Encounter: Payer: Self-pay | Admitting: Radiology

## 2016-11-22 DIAGNOSIS — Z803 Family history of malignant neoplasm of breast: Secondary | ICD-10-CM

## 2016-11-22 MED ORDER — GADOBENATE DIMEGLUMINE 529 MG/ML IV SOLN
14.0000 mL | Freq: Once | INTRAVENOUS | Status: AC | PRN
  Administered 2016-11-22: 14 mL via INTRAVENOUS

## 2017-03-06 DIAGNOSIS — J310 Chronic rhinitis: Secondary | ICD-10-CM | POA: Diagnosis not present

## 2017-03-06 DIAGNOSIS — J343 Hypertrophy of nasal turbinates: Secondary | ICD-10-CM | POA: Diagnosis not present

## 2017-03-06 DIAGNOSIS — R430 Anosmia: Secondary | ICD-10-CM | POA: Diagnosis not present

## 2017-07-13 DIAGNOSIS — J342 Deviated nasal septum: Secondary | ICD-10-CM | POA: Diagnosis not present

## 2017-07-13 DIAGNOSIS — J343 Hypertrophy of nasal turbinates: Secondary | ICD-10-CM | POA: Diagnosis not present

## 2017-11-01 DIAGNOSIS — D2271 Melanocytic nevi of right lower limb, including hip: Secondary | ICD-10-CM | POA: Diagnosis not present

## 2017-11-01 DIAGNOSIS — L821 Other seborrheic keratosis: Secondary | ICD-10-CM | POA: Diagnosis not present

## 2017-11-01 DIAGNOSIS — D2261 Melanocytic nevi of right upper limb, including shoulder: Secondary | ICD-10-CM | POA: Diagnosis not present

## 2017-11-01 DIAGNOSIS — D225 Melanocytic nevi of trunk: Secondary | ICD-10-CM | POA: Diagnosis not present

## 2017-11-12 DIAGNOSIS — Z0000 Encounter for general adult medical examination without abnormal findings: Secondary | ICD-10-CM | POA: Diagnosis not present

## 2017-11-12 DIAGNOSIS — E7800 Pure hypercholesterolemia, unspecified: Secondary | ICD-10-CM | POA: Diagnosis not present

## 2017-11-18 DIAGNOSIS — Z6823 Body mass index (BMI) 23.0-23.9, adult: Secondary | ICD-10-CM | POA: Diagnosis not present

## 2017-11-18 DIAGNOSIS — Z1389 Encounter for screening for other disorder: Secondary | ICD-10-CM | POA: Diagnosis not present

## 2017-11-18 DIAGNOSIS — Z0000 Encounter for general adult medical examination without abnormal findings: Secondary | ICD-10-CM | POA: Diagnosis not present

## 2017-11-18 DIAGNOSIS — J3089 Other allergic rhinitis: Secondary | ICD-10-CM | POA: Diagnosis not present

## 2017-11-18 DIAGNOSIS — D229 Melanocytic nevi, unspecified: Secondary | ICD-10-CM | POA: Diagnosis not present

## 2017-11-18 DIAGNOSIS — Z23 Encounter for immunization: Secondary | ICD-10-CM | POA: Diagnosis not present

## 2017-11-18 DIAGNOSIS — E7849 Other hyperlipidemia: Secondary | ICD-10-CM | POA: Diagnosis not present

## 2017-11-19 DIAGNOSIS — J343 Hypertrophy of nasal turbinates: Secondary | ICD-10-CM | POA: Diagnosis not present

## 2017-11-26 DIAGNOSIS — Z6823 Body mass index (BMI) 23.0-23.9, adult: Secondary | ICD-10-CM | POA: Diagnosis not present

## 2017-11-26 DIAGNOSIS — Z1231 Encounter for screening mammogram for malignant neoplasm of breast: Secondary | ICD-10-CM | POA: Diagnosis not present

## 2017-11-26 DIAGNOSIS — Z01419 Encounter for gynecological examination (general) (routine) without abnormal findings: Secondary | ICD-10-CM | POA: Diagnosis not present

## 2018-11-17 DIAGNOSIS — E7849 Other hyperlipidemia: Secondary | ICD-10-CM | POA: Diagnosis not present

## 2018-11-17 DIAGNOSIS — Z0000 Encounter for general adult medical examination without abnormal findings: Secondary | ICD-10-CM | POA: Diagnosis not present

## 2018-11-25 DIAGNOSIS — Z1389 Encounter for screening for other disorder: Secondary | ICD-10-CM | POA: Diagnosis not present

## 2018-11-25 DIAGNOSIS — J309 Allergic rhinitis, unspecified: Secondary | ICD-10-CM | POA: Diagnosis not present

## 2018-11-25 DIAGNOSIS — Z0000 Encounter for general adult medical examination without abnormal findings: Secondary | ICD-10-CM | POA: Diagnosis not present

## 2018-11-25 DIAGNOSIS — Z6823 Body mass index (BMI) 23.0-23.9, adult: Secondary | ICD-10-CM | POA: Diagnosis not present

## 2018-11-25 DIAGNOSIS — E7849 Other hyperlipidemia: Secondary | ICD-10-CM | POA: Diagnosis not present

## 2018-11-25 DIAGNOSIS — D229 Melanocytic nevi, unspecified: Secondary | ICD-10-CM | POA: Diagnosis not present

## 2018-11-25 DIAGNOSIS — Z1331 Encounter for screening for depression: Secondary | ICD-10-CM | POA: Diagnosis not present

## 2019-02-26 DIAGNOSIS — D225 Melanocytic nevi of trunk: Secondary | ICD-10-CM | POA: Diagnosis not present

## 2019-02-26 DIAGNOSIS — L821 Other seborrheic keratosis: Secondary | ICD-10-CM | POA: Diagnosis not present

## 2019-02-26 DIAGNOSIS — L813 Cafe au lait spots: Secondary | ICD-10-CM | POA: Diagnosis not present

## 2019-02-26 DIAGNOSIS — D224 Melanocytic nevi of scalp and neck: Secondary | ICD-10-CM | POA: Diagnosis not present

## 2019-04-21 DIAGNOSIS — Z01419 Encounter for gynecological examination (general) (routine) without abnormal findings: Secondary | ICD-10-CM | POA: Diagnosis not present

## 2019-04-21 DIAGNOSIS — Z1231 Encounter for screening mammogram for malignant neoplasm of breast: Secondary | ICD-10-CM | POA: Diagnosis not present

## 2019-04-21 DIAGNOSIS — Z803 Family history of malignant neoplasm of breast: Secondary | ICD-10-CM | POA: Diagnosis not present

## 2019-04-21 DIAGNOSIS — Z6823 Body mass index (BMI) 23.0-23.9, adult: Secondary | ICD-10-CM | POA: Diagnosis not present

## 2019-06-05 DIAGNOSIS — Z803 Family history of malignant neoplasm of breast: Secondary | ICD-10-CM | POA: Diagnosis not present

## 2019-06-15 DIAGNOSIS — M7502 Adhesive capsulitis of left shoulder: Secondary | ICD-10-CM | POA: Diagnosis not present

## 2019-06-17 DIAGNOSIS — M7502 Adhesive capsulitis of left shoulder: Secondary | ICD-10-CM | POA: Diagnosis not present

## 2019-11-18 DIAGNOSIS — R0981 Nasal congestion: Secondary | ICD-10-CM | POA: Diagnosis not present

## 2019-11-18 DIAGNOSIS — Z20828 Contact with and (suspected) exposure to other viral communicable diseases: Secondary | ICD-10-CM | POA: Diagnosis not present

## 2019-11-18 DIAGNOSIS — R439 Unspecified disturbances of smell and taste: Secondary | ICD-10-CM | POA: Diagnosis not present

## 2019-11-24 DIAGNOSIS — E7849 Other hyperlipidemia: Secondary | ICD-10-CM | POA: Diagnosis not present

## 2019-11-24 DIAGNOSIS — Z0000 Encounter for general adult medical examination without abnormal findings: Secondary | ICD-10-CM | POA: Diagnosis not present

## 2020-01-15 DIAGNOSIS — E785 Hyperlipidemia, unspecified: Secondary | ICD-10-CM | POA: Diagnosis not present

## 2020-01-15 DIAGNOSIS — J309 Allergic rhinitis, unspecified: Secondary | ICD-10-CM | POA: Diagnosis not present

## 2020-01-15 DIAGNOSIS — Z1331 Encounter for screening for depression: Secondary | ICD-10-CM | POA: Diagnosis not present

## 2020-01-15 DIAGNOSIS — D229 Melanocytic nevi, unspecified: Secondary | ICD-10-CM | POA: Diagnosis not present

## 2020-01-15 DIAGNOSIS — M7500 Adhesive capsulitis of unspecified shoulder: Secondary | ICD-10-CM | POA: Diagnosis not present

## 2020-01-15 DIAGNOSIS — Z0000 Encounter for general adult medical examination without abnormal findings: Secondary | ICD-10-CM | POA: Diagnosis not present

## 2020-02-04 DIAGNOSIS — Z20828 Contact with and (suspected) exposure to other viral communicable diseases: Secondary | ICD-10-CM | POA: Diagnosis not present

## 2020-08-31 DIAGNOSIS — Z1231 Encounter for screening mammogram for malignant neoplasm of breast: Secondary | ICD-10-CM | POA: Diagnosis not present

## 2020-08-31 DIAGNOSIS — Z6823 Body mass index (BMI) 23.0-23.9, adult: Secondary | ICD-10-CM | POA: Diagnosis not present

## 2020-08-31 DIAGNOSIS — Z01419 Encounter for gynecological examination (general) (routine) without abnormal findings: Secondary | ICD-10-CM | POA: Diagnosis not present

## 2021-01-31 DIAGNOSIS — D225 Melanocytic nevi of trunk: Secondary | ICD-10-CM | POA: Diagnosis not present

## 2021-01-31 DIAGNOSIS — L308 Other specified dermatitis: Secondary | ICD-10-CM | POA: Diagnosis not present

## 2021-01-31 DIAGNOSIS — D2261 Melanocytic nevi of right upper limb, including shoulder: Secondary | ICD-10-CM | POA: Diagnosis not present

## 2021-01-31 DIAGNOSIS — L814 Other melanin hyperpigmentation: Secondary | ICD-10-CM | POA: Diagnosis not present

## 2021-01-31 DIAGNOSIS — L820 Inflamed seborrheic keratosis: Secondary | ICD-10-CM | POA: Diagnosis not present

## 2021-05-01 DIAGNOSIS — Z803 Family history of malignant neoplasm of breast: Secondary | ICD-10-CM | POA: Diagnosis not present

## 2021-05-01 DIAGNOSIS — N644 Mastodynia: Secondary | ICD-10-CM | POA: Diagnosis not present

## 2021-05-03 ENCOUNTER — Other Ambulatory Visit: Payer: Self-pay | Admitting: Obstetrics and Gynecology

## 2021-05-03 DIAGNOSIS — Z803 Family history of malignant neoplasm of breast: Secondary | ICD-10-CM

## 2022-03-21 DIAGNOSIS — Z01419 Encounter for gynecological examination (general) (routine) without abnormal findings: Secondary | ICD-10-CM | POA: Diagnosis not present

## 2022-03-21 DIAGNOSIS — Z1231 Encounter for screening mammogram for malignant neoplasm of breast: Secondary | ICD-10-CM | POA: Diagnosis not present

## 2022-03-21 DIAGNOSIS — Z6823 Body mass index (BMI) 23.0-23.9, adult: Secondary | ICD-10-CM | POA: Diagnosis not present

## 2022-04-18 DIAGNOSIS — Z1211 Encounter for screening for malignant neoplasm of colon: Secondary | ICD-10-CM | POA: Diagnosis not present

## 2023-02-07 DIAGNOSIS — L820 Inflamed seborrheic keratosis: Secondary | ICD-10-CM | POA: Diagnosis not present

## 2023-03-05 ENCOUNTER — Encounter: Payer: Self-pay | Admitting: Gastroenterology

## 2023-05-22 ENCOUNTER — Encounter: Payer: Self-pay | Admitting: Gastroenterology

## 2023-05-22 ENCOUNTER — Ambulatory Visit (INDEPENDENT_AMBULATORY_CARE_PROVIDER_SITE_OTHER): Payer: BC Managed Care – PPO | Admitting: Gastroenterology

## 2023-05-22 VITALS — BP 106/70 | HR 56 | Ht 66.0 in | Wt 151.2 lb

## 2023-05-22 DIAGNOSIS — Z1211 Encounter for screening for malignant neoplasm of colon: Secondary | ICD-10-CM | POA: Diagnosis not present

## 2023-05-22 MED ORDER — ONDANSETRON HCL 4 MG PO TABS: 4.0000 mg | ORAL_TABLET | Freq: Three times a day (TID) | ORAL | 0 refills | Status: AC | PRN

## 2023-05-22 NOTE — Patient Instructions (Addendum)
You have been scheduled for a colonoscopy. Please follow written instructions given to you at your visit today.  Please pick up your prep supplies at the pharmacy within the next 1-3 days. If you use inhalers (even only as needed), please bring them with you on the day of your procedure.   We have sent the following medications to your pharmacy for you to pick up at your convenience:  [facility]  Due to recent changes in healthcare laws, you may see the results of your imaging and laboratory studies on MyChart before your provider has had a chance to review them.  We understand that in some cases there may be results that are confusing or concerning to you. Not all laboratory results come back in the same time frame and the provider may be waiting for multiple results in order to interpret others.  Please give [place] 48 hours in order for your provider to thoroughly review all the results before contacting the office for clarification of your results.    I appreciate the  opportunity to care for you  Thank You   Marsa Aris , MD

## 2023-05-22 NOTE — Progress Notes (Signed)
Deanna Wolf    [id number]    [birth year]  Primary Care Physician:Russo, Jonny Ruiz, MD  Referring Physician: Creola Corn, MD [address] [place],  [place] [address]   Chief complaint:  Chief Complaint  Patient presents with   Colon Cancer Screening    Patient has no complaints and there is no family history of colon cancer.    HPI: Deanna Wolf is a 47 y.o. female presenting to clinic today for consult for screening colonoscopy.  Today, she reports well overall. She denies any GI issues. She states that she has one hemorrhoid from her third pregnancy. She states that she occasionally experiences constipation.    she denies diarrhea, constipation, nausea, blood in stool, black stool, vomiting, abdominal pain, bloating, unintentional weight loss, reflux, dysphagia.   GI Hx:  None    No current outpatient medications on file.   Allergies as of 05/22/2023   (No Known Allergies)    Past Medical History:  Diagnosis Date   AMA (advanced maternal age) multigravida 35+    Thrombocytopenia complicating pregnancy (HCC)    Varicose veins     Past Surgical History:  Procedure Laterality Date   CESAREAN SECTION     WISDOM TOOTH EXTRACTION      Family History  Problem Relation Age of Onset   Cancer Mother        breast   Hypertension Father     Social History   Socioeconomic History   Marital status: Married    Spouse name: Not on file   Number of children: 3   Years of education: Not on file   Highest education level: Not on file  Occupational History   Not on file  Tobacco Use   Smoking status: Never   Smokeless tobacco: Never  Substance and Sexual Activity   Alcohol use: No   Drug use: No   Sexual activity: Yes  Other Topics Concern   Not on file  Social History Narrative   Not on file   Social Determinants of Health   Financial Resource Strain: Not on file  Food Insecurity: Not on file  Transportation Needs: Not on file   Physical Activity: Not on file  Stress: Not on file  Social Connections: Not on file  Intimate Partner Violence: Not on file      Review of systems: Review of Systems  Constitutional:  Negative for unexpected weight change.  HENT:  Negative for trouble swallowing.   Gastrointestinal:  Negative for abdominal distention, abdominal pain, anal bleeding, blood in stool, constipation, diarrhea, nausea, rectal pain and vomiting.      Physical Exam: Vitals:   05/22/23 1012  BP: 106/70  Pulse: (!) 56   Body mass index is 24.4 kg/m.  General: well-appearing   Eyes: sclera anicteric, no redness CV: RRR, no JVD, no peripheral edema Resp: clear to auscultation bilaterally, normal RR and effort noted GI: soft, no tenderness, with active bowel sounds. No guarding or palpable organomegaly noted. Skin; warm and dry, no rash or jaundice noted Neuro: awake, alert and oriented x 3. Normal gross motor function and fluent speech   Data Reviewed:  Reviewed labs, radiology imaging, old records and pertinent past GI work up   Assessment and Plan/Recommendations: 47 year old very pleasant female here to discuss colorectal cancer screening Will schedule the procedure at [facility] The risks and benefits as well as alternatives of endoscopic procedure(s) have been discussed and reviewed. All questions answered.  The patient agrees to proceed.  Advised her to use Zofran ODT 4 mg every 8 hours as needed along with bowel prep to prevent nausea Return as needed   The patient was provided an opportunity to ask questions and all were answered. The patient agreed with the plan and demonstrated an understanding of the instructions.  Iona Beard , MD  CC: Creola Corn, MD   Ladona Mow Hewitt Shorts as a scribe for Marsa Aris, MD.,have documented all relevant documentation on the behalf of Marsa Aris, MD,as directed by  Marsa Aris, MD while in the presence of Marsa Aris,  MD.   I, Marsa Aris, MD, have reviewed all documentation for this visit. The documentation on 05/22/23 for the exam, diagnosis, procedures, and orders are all accurate and complete.

## 2023-05-28 DIAGNOSIS — E785 Hyperlipidemia, unspecified: Secondary | ICD-10-CM | POA: Diagnosis not present

## 2023-05-31 ENCOUNTER — Encounter: Payer: Self-pay | Admitting: Gastroenterology

## 2023-05-31 DIAGNOSIS — Z0000 Encounter for general adult medical examination without abnormal findings: Secondary | ICD-10-CM | POA: Diagnosis not present

## 2023-05-31 DIAGNOSIS — R82998 Other abnormal findings in urine: Secondary | ICD-10-CM | POA: Diagnosis not present

## 2023-05-31 DIAGNOSIS — Z23 Encounter for immunization: Secondary | ICD-10-CM | POA: Diagnosis not present

## 2023-05-31 DIAGNOSIS — Z1331 Encounter for screening for depression: Secondary | ICD-10-CM | POA: Diagnosis not present

## 2023-08-07 ENCOUNTER — Telehealth: Payer: Self-pay | Admitting: Gastroenterology

## 2023-08-07 MED ORDER — NA SULFATE-K SULFATE-MG SULF 17.5-3.13-1.6 GM/177ML PO SOLN: 1.0000 | Freq: Once | ORAL | 0 refills | Status: AC

## 2023-08-07 NOTE — Telephone Encounter (Signed)
Patient called to ask if her prep medication can be sent to [facility] on file.

## 2023-08-07 NOTE — Telephone Encounter (Signed)
I sent Suprep kit to Deanna Wolf as requested electronically today

## 2023-08-13 ENCOUNTER — Ambulatory Visit (AMBULATORY_SURGERY_CENTER): Payer: BC Managed Care – PPO | Admitting: Gastroenterology

## 2023-08-13 ENCOUNTER — Encounter: Payer: Self-pay | Admitting: Gastroenterology

## 2023-08-13 VITALS — BP 110/76 | HR 62 | Temp 98.9°F | Resp 12 | Ht 66.0 in | Wt 151.0 lb

## 2023-08-13 DIAGNOSIS — Z1211 Encounter for screening for malignant neoplasm of colon: Secondary | ICD-10-CM

## 2023-08-13 MED ORDER — SODIUM CHLORIDE 0.9 % IV SOLN: 500.0000 mL | Freq: Once | INTRAVENOUS | Status: DC

## 2023-08-13 NOTE — Progress Notes (Unsigned)
Uneventful anesthetic. Report to pacu rn. Vss. Care resumed by rn. 

## 2023-08-13 NOTE — Op Note (Signed)
[facility] Patient Name: Deanna Wolf Procedure Date: 08/13/2023 1:25 PM MRN: [id number] Endoscopist: Napoleon Form , MD, [id number] Age: 47 Referring MD:  Date of Birth: April 01, 1976 Gender: Female Account #: [id number] Procedure:                Colonoscopy Indications:              Screening for colorectal malignant neoplasm Medicines:                Monitored Anesthesia Care Procedure:                Pre-Anesthesia Assessment:                           - Prior to the procedure, a History and Physical                            was performed, and patient medications and                            allergies were reviewed. The patient's tolerance of                            previous anesthesia was also reviewed. The risks                            and benefits of the procedure and the sedation                            options and risks were discussed with the patient.                            All questions were answered, and informed consent                            was obtained. Prior Anticoagulants: The patient has                            taken no anticoagulant or antiplatelet agents. ASA                            Grade Assessment: I - A normal, healthy patient.                            After reviewing the risks and benefits, the patient                            was deemed in satisfactory condition to undergo the                            procedure.                           After obtaining informed consent, the colonoscope  was passed under direct vision. Throughout the                            procedure, the patient's blood pressure, pulse, and                            oxygen saturations were monitored continuously. The                            [id number]-HQ190L Colonoscope [id number] was introduced                            through the anus and advanced to the the cecum,                            identified by  appendiceal orifice and ileocecal                            valve. The colonoscopy was performed without                            difficulty. The patient tolerated the procedure                            well. The quality of the bowel preparation was                            good. The ileocecal valve, appendiceal orifice, and                            rectum were photographed. Scope In: 1:42:09 PM Scope Out: 1:59:13 PM Scope Withdrawal Time: 0 hours 8 minutes 22 seconds  Total Procedure Duration: 0 hours 17 minutes 4 seconds  Findings:                 The perianal and digital rectal examinations were                            normal.                           Non-bleeding internal hemorrhoids were found during                            retroflexion. The hemorrhoids were medium-sized.                           The exam was otherwise without abnormality. Complications:            No immediate complications. Estimated Blood Loss:     Estimated blood loss: none. Impression:               - Non-bleeding internal hemorrhoids.                           - The examination was otherwise normal.                           -  No specimens collected. Recommendation:           - Resume previous diet.                           - Continue present medications.                           - Repeat colonoscopy in 10 years for surveillance. Napoleon Form, MD 08/13/2023 2:04:11 PM This report has been signed electronically.

## 2023-08-13 NOTE — Patient Instructions (Signed)
Discharge instructions given. Handout on Hemorrhoids. Resume previous medications. YOU HAD AN ENDOSCOPIC PROCEDURE TODAY AT THE [facility]:   Refer to the procedure report that was given to you for any specific questions about what was found during the examination.  If the procedure report does not answer your questions, please call your gastroenterologist to clarify.  If you requested that your care partner not be given the details of your procedure findings, then the procedure report has been included in a sealed envelope for you to review at your convenience later.  YOU SHOULD EXPECT: Some feelings of bloating in the abdomen. Passage of more gas than usual.  Walking can help get rid of the air that was put into your GI tract during the procedure and reduce the bloating. If you had a lower endoscopy (such as a colonoscopy or flexible sigmoidoscopy) you may notice spotting of blood in your stool or on the toilet paper. If you underwent a bowel prep for your procedure, you may not have a normal bowel movement for a few days.  Please Note:  You might notice some irritation and congestion in your nose or some drainage.  This is from the oxygen used during your procedure.  There is no need for concern and it should clear up in a day or so.  SYMPTOMS TO REPORT IMMEDIATELY:  Following lower endoscopy (colonoscopy or flexible sigmoidoscopy):  Excessive amounts of blood in the stool  Significant tenderness or worsening of abdominal pains  Swelling of the abdomen that is new, acute  Fever of 100F or higher   For urgent or emergent issues, a gastroenterologist can be reached at any hour by calling [phone]. Do not use MyChart messaging for urgent concerns.    DIET:  We do recommend a small meal at first, but then you may proceed to your regular diet.  Drink plenty of fluids but you should avoid alcoholic beverages for 24 hours.  ACTIVITY:  You should plan to take it easy for the  rest of today and you should NOT DRIVE or use heavy machinery until tomorrow (because of the sedation medicines used during the test).    FOLLOW UP: Our staff will call the number listed on your records the next business day following your procedure.  We will call around 7:15- 8:00 am to check on you and address any questions or concerns that you may have regarding the information given to you following your procedure. If we do not reach you, we will leave a message.     If any biopsies were taken you will be contacted by phone or by letter within the next 1-3 weeks.  Please call us at [phone] if you have not heard about the biopsies in 3 weeks.    SIGNATURES/CONFIDENTIALITY: You and/or your care partner have signed paperwork which will be entered into your electronic medical record.  These signatures attest to the fact that that the information above on your After Visit Summary has been reviewed and is understood.  Full responsibility of the confidentiality of this discharge information lies with you and/or your care-partner. 

## 2023-08-13 NOTE — Progress Notes (Signed)
Pt's states no medical or surgical changes since previsit or office visit. VS assessed by D.T 

## 2023-08-13 NOTE — Progress Notes (Unsigned)
[facility] History and Physical   Primary Care Physician:  Creola Corn, MD   Reason for Procedure:  Colorectal cancer screening  Plan:    Screening colonoscopy with possible interventions as needed     HPI: Deanna Wolf is a very pleasant 47 y.o. female here for screening colonoscopy. Denies any nausea, vomiting, abdominal pain, melena or bright red blood per rectum  The risks and benefits as well as alternatives of endoscopic procedure(s) have been discussed and reviewed. All questions answered. The patient agrees to proceed.    Past Medical History:  Diagnosis Date   AMA (advanced maternal age) multigravida 35+    Thrombocytopenia complicating pregnancy (HCC)    Varicose veins     Past Surgical History:  Procedure Laterality Date   CESAREAN SECTION     WISDOM TOOTH EXTRACTION      Prior to Admission medications   Medication Sig Start Date End Date Taking? Authorizing Provider  fluticasone [facility] ALLERGY RELIEF) 50 MCG/ACT nasal spray 2 sprays each nostril daily for sinus congestion Nasal Patient not taking: Reported on 08/13/2023 03/28/15   [provider]  ondansetron (ZOFRAN) 4 MG tablet Take 1 tablet (4 mg total) by mouth every 8 (eight) hours as needed for nausea or vomiting. Patient not taking: Reported on 08/13/2023 05/22/23   Napoleon Form, MD    Current Outpatient Medications  Medication Sig Dispense Refill   fluticasone [facility] ALLERGY RELIEF) 50 MCG/ACT nasal spray 2 sprays each nostril daily for sinus congestion Nasal (Patient not taking: Reported on 08/13/2023)     ondansetron (ZOFRAN) 4 MG tablet Take 1 tablet (4 mg total) by mouth every 8 (eight) hours as needed for nausea or vomiting. (Patient not taking: Reported on 08/13/2023) 10 tablet 0   Current Facility-Administered Medications  Medication Dose Route Frequency Provider Last Rate Last Admin   0.9 %  sodium chloride infusion  500 mL Intravenous Once Napoleon Form, MD        Allergies as of 08/13/2023   (No Known Allergies)    Family History  Problem Relation Age of Onset   Cancer Mother        breast   Hypertension Father     Social History   Socioeconomic History   Marital status: Married    Spouse name: Not on file   Number of children: 3   Years of education: Not on file   Highest education level: Not on file  Occupational History   Not on file  Tobacco Use   Smoking status: Never   Smokeless tobacco: Never  Substance and Sexual Activity   Alcohol use: No   Drug use: No   Sexual activity: Yes  Other Topics Concern   Not on file  Social History Narrative   Not on file   Social Determinants of Health   Financial Resource Strain: Not on file  Food Insecurity: Not on file  Transportation Needs: Not on file  Physical Activity: Not on file  Stress: Not on file  Social Connections: Unknown (05/08/2022)   Received from [facility]   Social Network    Social Network: Not on file  Intimate Partner Violence: Unknown (03/30/2022)   Received from [facility]   HITS    Physically Hurt: Not on file    Insult or Talk Down To: Not on file    Threaten Physical Harm: Not on file    Scream or Curse: Not on file    Review of Systems:  All other review of systems negative except as mentioned in the HPI.  Physical Exam: Vital signs in last 24 hours: BP 122/79   Pulse (!) 55   Temp 98.9 F (37.2 C) (Skin)   Resp 11   Ht 5\' 6"  (1.676 m)   Wt 151 lb (68.5 kg)   SpO2 100%   BMI 24.37 kg/m  General:   Alert, NAD Lungs:  Clear .   Heart:  Regular rate and rhythm Abdomen:  Soft, nontender and nondistended. Neuro/Psych:  Alert and cooperative. Normal mood and affect. A and O x 3  Reviewed labs, radiology imaging, old records and pertinent past GI work up  Patient is appropriate for planned procedure(s) and anesthesia in an ambulatory setting   K. Scherry Ran , MD [phone]

## 2023-08-14 ENCOUNTER — Telehealth: Payer: Self-pay

## 2023-08-14 NOTE — Telephone Encounter (Signed)
Follow up call to pt, lm for pt to call if having any difficulty with normal activities or eating and drinking.  Also to call if any other questions or concerns.  

## 2023-08-16 DIAGNOSIS — Z124 Encounter for screening for malignant neoplasm of cervix: Secondary | ICD-10-CM | POA: Diagnosis not present

## 2023-08-16 DIAGNOSIS — Z1231 Encounter for screening mammogram for malignant neoplasm of breast: Secondary | ICD-10-CM | POA: Diagnosis not present

## 2023-08-16 DIAGNOSIS — Z01419 Encounter for gynecological examination (general) (routine) without abnormal findings: Secondary | ICD-10-CM | POA: Diagnosis not present

## 2023-08-16 DIAGNOSIS — Z6823 Body mass index (BMI) 23.0-23.9, adult: Secondary | ICD-10-CM | POA: Diagnosis not present

## 2023-08-20 ENCOUNTER — Other Ambulatory Visit: Payer: Self-pay | Admitting: Obstetrics and Gynecology

## 2023-08-20 DIAGNOSIS — Z9189 Other specified personal risk factors, not elsewhere classified: Secondary | ICD-10-CM

## 2024-01-08 DIAGNOSIS — L814 Other melanin hyperpigmentation: Secondary | ICD-10-CM | POA: Diagnosis not present

## 2024-01-08 DIAGNOSIS — D2272 Melanocytic nevi of left lower limb, including hip: Secondary | ICD-10-CM | POA: Diagnosis not present

## 2024-01-08 DIAGNOSIS — D225 Melanocytic nevi of trunk: Secondary | ICD-10-CM | POA: Diagnosis not present

## 2024-01-08 DIAGNOSIS — L438 Other lichen planus: Secondary | ICD-10-CM | POA: Diagnosis not present

## 2024-03-28 ENCOUNTER — Ambulatory Visit
Admission: RE | Admit: 2024-03-28 | Discharge: 2024-03-28 | Disposition: A | Discharge status: Home / Self Care | Source: Ambulatory Visit | Attending: Obstetrics and Gynecology | Admitting: Obstetrics and Gynecology

## 2024-03-28 DIAGNOSIS — Z1239 Encounter for other screening for malignant neoplasm of breast: Secondary | ICD-10-CM | POA: Diagnosis not present

## 2024-03-28 DIAGNOSIS — Z803 Family history of malignant neoplasm of breast: Secondary | ICD-10-CM | POA: Diagnosis not present

## 2024-03-28 DIAGNOSIS — Z9189 Other specified personal risk factors, not elsewhere classified: Secondary | ICD-10-CM

## 2024-03-28 MED ORDER — GADOPICLENOL 0.5 MMOL/ML IV SOLN
7.0000 mL | Freq: Once | INTRAVENOUS | Status: AC | PRN
  Administered 2024-03-28: 7 mL via INTRAVENOUS

## 2024-08-10 DIAGNOSIS — E785 Hyperlipidemia, unspecified: Secondary | ICD-10-CM | POA: Diagnosis not present

## 2024-08-13 ENCOUNTER — Other Ambulatory Visit: Payer: Self-pay | Admitting: Surgery

## 2024-08-13 DIAGNOSIS — I83893 Varicose veins of bilateral lower extremities with other complications: Secondary | ICD-10-CM

## 2024-08-17 DIAGNOSIS — Z1331 Encounter for screening for depression: Secondary | ICD-10-CM | POA: Diagnosis not present

## 2024-08-17 DIAGNOSIS — Z0000 Encounter for general adult medical examination without abnormal findings: Secondary | ICD-10-CM | POA: Diagnosis not present

## 2024-08-17 DIAGNOSIS — R82998 Other abnormal findings in urine: Secondary | ICD-10-CM | POA: Diagnosis not present

## 2024-08-19 DIAGNOSIS — Z6823 Body mass index (BMI) 23.0-23.9, adult: Secondary | ICD-10-CM | POA: Diagnosis not present

## 2024-08-19 DIAGNOSIS — Z1231 Encounter for screening mammogram for malignant neoplasm of breast: Secondary | ICD-10-CM | POA: Diagnosis not present

## 2024-08-19 DIAGNOSIS — Z01419 Encounter for gynecological examination (general) (routine) without abnormal findings: Secondary | ICD-10-CM | POA: Diagnosis not present

## 2024-09-14 ENCOUNTER — Ambulatory Visit

## 2024-09-14 ENCOUNTER — Ambulatory Visit (HOSPITAL_COMMUNITY)
Admission: RE | Admit: 2024-09-14 | Discharge: 2024-09-14 | Disposition: A | Discharge status: Home / Self Care | Source: Ambulatory Visit | Attending: Surgery | Admitting: Surgery

## 2024-09-14 DIAGNOSIS — I83893 Varicose veins of bilateral lower extremities with other complications: Secondary | ICD-10-CM | POA: Diagnosis not present

## 2024-09-16 ENCOUNTER — Ambulatory Visit: Attending: Vascular Surgery | Admitting: Vascular Surgery

## 2024-09-16 ENCOUNTER — Encounter: Payer: Self-pay | Admitting: Vascular Surgery

## 2024-09-16 VITALS — BP 116/77 | HR 65 | Temp 98.6°F | Resp 16 | Ht 66.0 in | Wt 150.2 lb

## 2024-09-16 DIAGNOSIS — I83893 Varicose veins of bilateral lower extremities with other complications: Secondary | ICD-10-CM

## 2024-09-16 NOTE — Progress Notes (Signed)
 Office Note     CC: Pain in right lower extremity varicosity Requesting Provider:  Onita Rush, MD  HPI: KIAIRA Wolf is a 48 y.o. (1976-03-11) female who presents at the request of Onita Rush, MD for evaluation of pain to right lower extremity varicosity.  On exam, Deanna Wolf was doing well.  A native of [place], she moved to [place] to raise her family.  Her husband is from here.  She has 3 boys, all at [employer].  Damaya is known to our practice having previously undergone right lower extremity stab phlebectomy, greater saphenous vein ablation.  This was completed in 2013 by Dr. RODGER Collum, who has since retired. Over the last several years, she has appreciated worsening of a large varicosity on the anterior side of the right leg extending from the calf, into the groin.  Over the last several months, the worsening edema has been accompanied with pain to palpation, burning,  Wears thigh-high compression stockings intermittently.  No history of DVT previously  Past Medical History:  Diagnosis Date   AMA (advanced maternal age) multigravida 35+    Peripheral vascular disease    Thrombocytopenia complicating pregnancy    Varicose veins     Past Surgical History:  Procedure Laterality Date   CESAREAN SECTION     WISDOM TOOTH EXTRACTION      Social History   Socioeconomic History   Marital status: Married    Spouse name: Not on file   Number of children: 3   Years of education: Not on file   Highest education level: Not on file  Occupational History   Not on file  Tobacco Use   Smoking status: Never   Smokeless tobacco: Never  Vaping Use   Vaping status: Never Used  Substance and Sexual Activity   Alcohol  use: No   Drug use: No   Sexual activity: Yes  Other Topics Concern   Not on file  Social History Narrative   Not on file   Social Drivers of Health   Financial Resource Strain: Not on file  Food Insecurity: Not on file  Transportation  Needs: Not on file  Physical Activity: Not on file  Stress: Not on file  Social Connections: Unknown (05/08/2022)   Received from [facility]   Social Network    Social Network: Not on file  Intimate Partner Violence: Unknown (03/30/2022)   Received from [facility]   HITS    Physically Hurt: Not on file    Insult or Talk Down To: Not on file    Threaten Physical Harm: Not on file    Scream or Curse: Not on file   Family History  Problem Relation Age of Onset   Cancer Mother        breast   Hypertension Father     Current Outpatient Medications  Medication Sig Dispense Refill   fluticasone (FLONASE ALLERGY RELIEF) 50 MCG/ACT nasal spray 2 sprays each nostril daily for sinus congestion Nasal     ondansetron  (ZOFRAN ) 4 MG tablet Take 1 tablet (4 mg total) by mouth every 8 (eight) hours as needed for nausea or vomiting. 10 tablet 0   No current facility-administered medications for this visit.    No Known Allergies   REVIEW OF SYSTEMS:  [X]  denotes positive finding, [ ]  denotes negative finding Cardiac  Comments:  Chest pain or chest pressure:    Shortness of breath upon exertion:    Short of breath when lying flat:  Irregular heart rhythm:        Vascular    Pain in calf, thigh, or hip brought on by ambulation:    Pain in feet at night that wakes you up from your sleep:     Blood clot in your veins:    Leg swelling:         Pulmonary    Oxygen at home:    Productive cough:     Wheezing:         Neurologic    Sudden weakness in arms or legs:     Sudden numbness in arms or legs:     Sudden onset of difficulty speaking or slurred speech:    Temporary loss of vision in one eye:     Problems with dizziness:         Gastrointestinal    Blood in stool:     Vomited blood:         Genitourinary    Burning when urinating:     Blood in urine:        Psychiatric    Major depression:         Hematologic    Bleeding problems:    Problems with blood clotting  too easily:        Skin    Rashes or ulcers:        Constitutional    Fever or chills:      PHYSICAL EXAMINATION:  Vitals:   09/16/24 1210  BP: 116/77  Pulse: 65  Resp: 16  Temp: 98.6 F (37 C)  TempSrc: Temporal  SpO2: 99%  Weight: 150 lb 3.2 oz (68.1 kg)  Height: 5' 6 (1.676 m)    General:  WDWN in NAD; vital signs documented above Gait: Not observed HENT: WNL, normocephalic Pulmonary: normal non-labored breathing , without Rales, rhonchi,  wheezing Cardiac: regular HR, Abdomen: soft, NT, no masses Skin: without rashes Vascular Exam/Pulses:  Right Left  Radial 2+ (normal) 2+ (normal)  Ulnar    Femoral    Popliteal    DP 2+ (normal) 2+ (normal)  PT     Extremities: without ischemic changes, without Gangrene , without cellulitis; without open wounds;  Musculoskeletal: no muscle wasting or atrophy  Neurologic: A&O X 3;  No focal weakness or paresthesias are detected Psychiatric:  The pt has Normal affect.   Non-Invasive Vascular Imaging:     Summary:  Right:  - No evidence of deep vein thrombosis seen in the right lower extremity,  from the common femoral through the popliteal veins.  - No evidence of superficial venous thrombosis in the right lower  extremity.  - No evidence of superficial venous reflux seen in the right greater  saphenous vein.  - No evidence of superficial venous reflux seen in the right short  saphenous vein.  There is evidence of superficial venous reflux in the anterior accessory  saphenous vein from the prox thigh to lateral knee.    *See table(s) above for measurements and observations.   Electronically signed by Gaile New MD on 09/14/2024 at 4:01:43 PM.        ASSESSMENT/PLAN:: 48 y.o. female presenting with worsening pain in right lower extremity varicosity with accompanying edema.  On physical exam, there is a large, ropey varicosity extending from the ankle to the medial portion of the thigh.  This appears to be  the anterior accessory vein.  There is a small amount of asymmetric edema as well.  Imaging was reviewed demonstrating  what appears to be some recanalization of the previous right sided greater saphenous vein.  This is very minor.  At the current size, it is not amenable to further venous ablation.  I had a long discussion with Dorothyann regarding the above.  We discussed that while she has worn compression stockings intermittently, she would benefit from daily use over the next 3 months.  My plan is to see her back in the office to assess for improvement.  If she has very little improvement, I think she would be a great candidate for stab phlebectomy of the varicosity.   Fonda FORBES Rim, MD [facility] [phone]

## 2024-10-15 DIAGNOSIS — I87391 Chronic venous hypertension (idiopathic) with other complications of right lower extremity: Secondary | ICD-10-CM | POA: Diagnosis not present

## 2024-10-22 ENCOUNTER — Encounter

## 2024-10-27 DIAGNOSIS — I83891 Varicose veins of right lower extremities with other complications: Secondary | ICD-10-CM | POA: Diagnosis not present

## 2024-11-27 DIAGNOSIS — I83891 Varicose veins of right lower extremities with other complications: Secondary | ICD-10-CM | POA: Diagnosis not present

## 2024-12-01 DIAGNOSIS — I83891 Varicose veins of right lower extremities with other complications: Secondary | ICD-10-CM | POA: Diagnosis not present

## 2025-01-06 ENCOUNTER — Ambulatory Visit: Admitting: Vascular Surgery
